# Patient Record
Sex: Female | Born: 1980 | Race: White | Hispanic: No | Marital: Single | Smoking: Never smoker
Health system: Southern US, Community
[De-identification: ages and names within clinical notes are randomized; demographics above are authoritative.]

## PROBLEM LIST (undated history)

## (undated) DIAGNOSIS — K259 Gastric ulcer, unspecified as acute or chronic, without hemorrhage or perforation: Secondary | ICD-10-CM

## (undated) DIAGNOSIS — F39 Unspecified mood [affective] disorder: Secondary | ICD-10-CM

## (undated) DIAGNOSIS — K219 Gastro-esophageal reflux disease without esophagitis: Secondary | ICD-10-CM

## (undated) DIAGNOSIS — N809 Endometriosis, unspecified: Secondary | ICD-10-CM

## (undated) HISTORY — PX: DILATION AND CURETTAGE OF UTERUS: SHX78

---

## 2004-02-28 ENCOUNTER — Observation Stay: Payer: Self-pay

## 2004-03-08 ENCOUNTER — Observation Stay: Payer: Self-pay | Admitting: Obstetrics and Gynecology

## 2004-03-17 ENCOUNTER — Observation Stay: Payer: Self-pay

## 2004-03-19 ENCOUNTER — Observation Stay: Payer: Self-pay

## 2004-03-22 ENCOUNTER — Inpatient Hospital Stay: Payer: Self-pay

## 2004-11-15 ENCOUNTER — Observation Stay: Payer: Self-pay | Admitting: Obstetrics and Gynecology

## 2005-01-25 ENCOUNTER — Observation Stay: Payer: Self-pay | Admitting: Obstetrics and Gynecology

## 2005-02-05 ENCOUNTER — Observation Stay: Payer: Self-pay

## 2005-02-22 ENCOUNTER — Inpatient Hospital Stay: Payer: Self-pay

## 2005-04-15 ENCOUNTER — Inpatient Hospital Stay: Payer: Self-pay | Admitting: Psychiatry

## 2005-04-15 ENCOUNTER — Other Ambulatory Visit: Payer: Self-pay

## 2005-06-06 ENCOUNTER — Emergency Department: Payer: Self-pay | Admitting: Emergency Medicine

## 2005-07-08 ENCOUNTER — Emergency Department: Payer: Self-pay | Admitting: Emergency Medicine

## 2005-12-18 ENCOUNTER — Ambulatory Visit: Payer: Self-pay

## 2006-05-06 ENCOUNTER — Emergency Department: Payer: Self-pay | Admitting: Emergency Medicine

## 2006-05-08 ENCOUNTER — Ambulatory Visit: Payer: Self-pay | Admitting: Emergency Medicine

## 2006-05-09 ENCOUNTER — Emergency Department: Payer: Self-pay | Admitting: Emergency Medicine

## 2006-09-15 ENCOUNTER — Emergency Department: Payer: Self-pay | Admitting: Emergency Medicine

## 2006-10-17 ENCOUNTER — Emergency Department: Payer: Self-pay | Admitting: Emergency Medicine

## 2006-10-26 ENCOUNTER — Emergency Department: Payer: Self-pay

## 2006-11-12 ENCOUNTER — Inpatient Hospital Stay: Payer: Self-pay

## 2006-12-11 ENCOUNTER — Observation Stay: Payer: Self-pay | Admitting: Unknown Physician Specialty

## 2006-12-11 ENCOUNTER — Emergency Department: Payer: Self-pay | Admitting: Emergency Medicine

## 2006-12-12 ENCOUNTER — Observation Stay: Payer: Self-pay

## 2006-12-31 ENCOUNTER — Observation Stay: Payer: Self-pay | Admitting: Obstetrics & Gynecology

## 2007-01-05 ENCOUNTER — Emergency Department: Payer: Self-pay | Admitting: Emergency Medicine

## 2007-01-12 ENCOUNTER — Observation Stay: Payer: Self-pay

## 2007-02-23 ENCOUNTER — Observation Stay: Payer: Self-pay | Admitting: Obstetrics and Gynecology

## 2007-03-07 ENCOUNTER — Observation Stay: Payer: Self-pay

## 2007-03-08 ENCOUNTER — Observation Stay: Payer: Self-pay | Admitting: Obstetrics & Gynecology

## 2007-03-19 ENCOUNTER — Observation Stay: Payer: Self-pay | Admitting: Obstetrics & Gynecology

## 2007-03-21 ENCOUNTER — Inpatient Hospital Stay: Payer: Self-pay | Admitting: Obstetrics and Gynecology

## 2007-03-28 ENCOUNTER — Observation Stay: Payer: Self-pay | Admitting: Obstetrics & Gynecology

## 2007-03-30 ENCOUNTER — Inpatient Hospital Stay: Payer: Self-pay | Admitting: Obstetrics & Gynecology

## 2007-03-30 ENCOUNTER — Ambulatory Visit: Payer: Self-pay | Admitting: Obstetrics & Gynecology

## 2007-05-10 ENCOUNTER — Emergency Department: Payer: Self-pay | Admitting: Emergency Medicine

## 2007-05-17 ENCOUNTER — Emergency Department: Payer: Self-pay | Admitting: Emergency Medicine

## 2007-05-21 ENCOUNTER — Ambulatory Visit: Payer: Self-pay | Admitting: Obstetrics & Gynecology

## 2007-05-22 ENCOUNTER — Ambulatory Visit: Payer: Self-pay | Admitting: Obstetrics & Gynecology

## 2007-07-28 ENCOUNTER — Emergency Department (HOSPITAL_COMMUNITY): Admission: EM | Admit: 2007-07-28 | Discharge: 2007-07-28 | Payer: Self-pay | Admitting: Emergency Medicine

## 2007-07-31 ENCOUNTER — Emergency Department (HOSPITAL_COMMUNITY): Admission: EM | Admit: 2007-07-31 | Discharge: 2007-07-31 | Payer: Self-pay | Admitting: Emergency Medicine

## 2007-08-08 ENCOUNTER — Emergency Department (HOSPITAL_COMMUNITY): Admission: EM | Admit: 2007-08-08 | Discharge: 2007-08-08 | Payer: Self-pay | Admitting: Emergency Medicine

## 2007-08-10 ENCOUNTER — Emergency Department (HOSPITAL_COMMUNITY): Admission: EM | Admit: 2007-08-10 | Discharge: 2007-08-10 | Payer: Self-pay | Admitting: Emergency Medicine

## 2007-08-12 ENCOUNTER — Ambulatory Visit: Payer: Self-pay | Admitting: Family Medicine

## 2007-08-29 ENCOUNTER — Emergency Department: Payer: Self-pay | Admitting: Emergency Medicine

## 2007-09-12 ENCOUNTER — Emergency Department: Payer: Self-pay | Admitting: Internal Medicine

## 2007-09-13 ENCOUNTER — Emergency Department: Payer: Self-pay | Admitting: Internal Medicine

## 2007-11-08 ENCOUNTER — Emergency Department: Payer: Self-pay | Admitting: Emergency Medicine

## 2007-11-22 ENCOUNTER — Inpatient Hospital Stay: Payer: Self-pay

## 2007-11-25 ENCOUNTER — Emergency Department: Payer: Self-pay | Admitting: Emergency Medicine

## 2007-11-26 ENCOUNTER — Emergency Department: Payer: Self-pay | Admitting: Emergency Medicine

## 2007-12-05 ENCOUNTER — Ambulatory Visit: Payer: Self-pay

## 2007-12-06 ENCOUNTER — Inpatient Hospital Stay: Payer: Self-pay | Admitting: Obstetrics & Gynecology

## 2007-12-06 ENCOUNTER — Ambulatory Visit: Payer: Self-pay | Admitting: Cardiology

## 2007-12-07 ENCOUNTER — Other Ambulatory Visit: Payer: Self-pay

## 2007-12-23 ENCOUNTER — Other Ambulatory Visit: Payer: Self-pay

## 2007-12-23 ENCOUNTER — Emergency Department: Payer: Self-pay | Admitting: Emergency Medicine

## 2008-01-03 ENCOUNTER — Emergency Department: Payer: Self-pay | Admitting: Emergency Medicine

## 2008-01-07 ENCOUNTER — Emergency Department: Payer: Self-pay | Admitting: Emergency Medicine

## 2008-01-21 ENCOUNTER — Emergency Department: Payer: Self-pay | Admitting: Internal Medicine

## 2008-01-21 ENCOUNTER — Other Ambulatory Visit: Payer: Self-pay

## 2008-01-25 ENCOUNTER — Emergency Department: Payer: Self-pay | Admitting: Emergency Medicine

## 2008-01-30 ENCOUNTER — Emergency Department: Payer: Self-pay

## 2008-02-03 ENCOUNTER — Emergency Department: Payer: Self-pay | Admitting: Unknown Physician Specialty

## 2008-03-04 ENCOUNTER — Emergency Department: Payer: Self-pay | Admitting: Internal Medicine

## 2008-05-11 ENCOUNTER — Emergency Department: Payer: Self-pay | Admitting: Emergency Medicine

## 2008-05-25 ENCOUNTER — Emergency Department: Payer: Self-pay | Admitting: Emergency Medicine

## 2008-06-06 ENCOUNTER — Emergency Department: Payer: Self-pay | Admitting: Emergency Medicine

## 2008-06-10 ENCOUNTER — Emergency Department: Payer: Self-pay | Admitting: Internal Medicine

## 2008-07-13 ENCOUNTER — Emergency Department: Payer: Self-pay | Admitting: Emergency Medicine

## 2008-08-11 ENCOUNTER — Ambulatory Visit: Payer: Self-pay | Admitting: Obstetrics & Gynecology

## 2008-09-27 ENCOUNTER — Emergency Department: Payer: Self-pay | Admitting: Emergency Medicine

## 2008-10-07 ENCOUNTER — Emergency Department: Payer: Self-pay | Admitting: Emergency Medicine

## 2008-10-09 ENCOUNTER — Ambulatory Visit: Payer: Self-pay | Admitting: Family Medicine

## 2008-10-30 ENCOUNTER — Emergency Department: Payer: Self-pay | Admitting: Emergency Medicine

## 2008-11-03 ENCOUNTER — Emergency Department: Payer: Self-pay | Admitting: Emergency Medicine

## 2008-12-03 ENCOUNTER — Emergency Department: Payer: Self-pay | Admitting: Emergency Medicine

## 2008-12-04 ENCOUNTER — Emergency Department: Payer: Self-pay

## 2008-12-31 ENCOUNTER — Emergency Department: Payer: Self-pay | Admitting: Emergency Medicine

## 2009-01-28 ENCOUNTER — Emergency Department: Payer: Self-pay | Admitting: Emergency Medicine

## 2009-02-15 ENCOUNTER — Emergency Department: Payer: Self-pay | Admitting: Internal Medicine

## 2009-03-22 ENCOUNTER — Emergency Department: Payer: Self-pay | Admitting: Unknown Physician Specialty

## 2009-04-07 ENCOUNTER — Emergency Department: Payer: Self-pay | Admitting: Emergency Medicine

## 2009-04-11 ENCOUNTER — Emergency Department: Payer: Self-pay | Admitting: Unknown Physician Specialty

## 2009-05-25 ENCOUNTER — Emergency Department: Payer: Self-pay | Admitting: Emergency Medicine

## 2009-06-18 ENCOUNTER — Emergency Department: Payer: Self-pay | Admitting: Emergency Medicine

## 2009-06-23 ENCOUNTER — Emergency Department: Payer: Self-pay | Admitting: Emergency Medicine

## 2009-07-10 ENCOUNTER — Emergency Department: Payer: Self-pay | Admitting: Internal Medicine

## 2009-08-13 ENCOUNTER — Emergency Department: Payer: Self-pay | Admitting: Emergency Medicine

## 2009-08-16 ENCOUNTER — Emergency Department: Payer: Self-pay | Admitting: Emergency Medicine

## 2009-09-11 ENCOUNTER — Emergency Department: Payer: Self-pay | Admitting: Emergency Medicine

## 2009-12-02 ENCOUNTER — Emergency Department: Payer: Self-pay | Admitting: Emergency Medicine

## 2009-12-04 ENCOUNTER — Inpatient Hospital Stay: Payer: Self-pay | Admitting: Surgery

## 2009-12-30 ENCOUNTER — Inpatient Hospital Stay: Payer: Self-pay | Admitting: Internal Medicine

## 2010-01-04 ENCOUNTER — Emergency Department: Payer: Self-pay | Admitting: Emergency Medicine

## 2010-01-14 ENCOUNTER — Emergency Department: Payer: Self-pay | Admitting: Internal Medicine

## 2010-02-02 ENCOUNTER — Emergency Department: Payer: Self-pay | Admitting: Emergency Medicine

## 2010-06-03 ENCOUNTER — Emergency Department: Payer: Self-pay | Admitting: Emergency Medicine

## 2010-06-04 ENCOUNTER — Emergency Department: Payer: Self-pay | Admitting: Emergency Medicine

## 2010-06-25 ENCOUNTER — Emergency Department: Payer: Self-pay | Admitting: Emergency Medicine

## 2010-10-19 ENCOUNTER — Emergency Department: Payer: Self-pay | Admitting: Internal Medicine

## 2010-12-09 ENCOUNTER — Emergency Department: Payer: Self-pay | Admitting: Emergency Medicine

## 2010-12-15 ENCOUNTER — Emergency Department: Payer: Self-pay | Admitting: Emergency Medicine

## 2010-12-21 ENCOUNTER — Emergency Department: Payer: Self-pay | Admitting: Emergency Medicine

## 2011-01-16 ENCOUNTER — Emergency Department: Payer: Self-pay | Admitting: *Deleted

## 2011-01-17 ENCOUNTER — Emergency Department: Payer: Self-pay | Admitting: Emergency Medicine

## 2011-02-07 LAB — COMPREHENSIVE METABOLIC PANEL
BUN: 6
Calcium: 8.6
Creatinine, Ser: 0.73
Glucose, Bld: 109 — ABNORMAL HIGH
Total Protein: 7.3

## 2011-02-07 LAB — DIFFERENTIAL
Basophils Relative: 0
Lymphocytes Relative: 8 — ABNORMAL LOW
Lymphs Abs: 1.2
Monocytes Relative: 8
Neutro Abs: 12 — ABNORMAL HIGH
Neutrophils Relative %: 83 — ABNORMAL HIGH

## 2011-02-07 LAB — URINALYSIS, ROUTINE W REFLEX MICROSCOPIC
Glucose, UA: NEGATIVE
Protein, ur: NEGATIVE
Specific Gravity, Urine: 1.025
pH: 5.5

## 2011-02-07 LAB — CBC
HCT: 36.9
Hemoglobin: 12.6
MCHC: 34
Platelets: 242
RDW: 15.6 — ABNORMAL HIGH

## 2011-03-12 ENCOUNTER — Emergency Department: Payer: Self-pay | Admitting: Emergency Medicine

## 2011-04-28 ENCOUNTER — Emergency Department: Payer: Self-pay | Admitting: Emergency Medicine

## 2011-06-17 ENCOUNTER — Emergency Department: Payer: Self-pay | Admitting: Emergency Medicine

## 2011-06-29 ENCOUNTER — Emergency Department: Payer: Self-pay | Admitting: Emergency Medicine

## 2011-06-29 LAB — CBC
HCT: 39.5 % (ref 35.0–47.0)
HGB: 13.3 g/dL (ref 12.0–16.0)
Platelet: 202 10*3/uL (ref 150–440)
WBC: 13.8 10*3/uL — ABNORMAL HIGH (ref 3.6–11.0)

## 2011-06-29 LAB — BASIC METABOLIC PANEL
BUN: 7 mg/dL (ref 7–18)
Calcium, Total: 8.6 mg/dL (ref 8.5–10.1)
EGFR (Non-African Amer.): >60
Glucose: 94 mg/dL (ref 65–99)
Potassium: 3.8 mmol/L (ref 3.5–5.1)
Sodium: 137 mmol/L (ref 136–145)

## 2011-06-29 LAB — DIFFERENTIAL
Basophil %: 0.2 %
Eosinophil %: 0.2 %
Lymphocyte #: 1.8 10*3/uL (ref 1.0–3.6)
Monocyte #: 0.7 10*3/uL (ref 0.0–0.7)
Monocyte %: 5.2 %
Neutrophil #: 11.3 10*3/uL — ABNORMAL HIGH (ref 1.4–6.5)

## 2011-08-03 ENCOUNTER — Emergency Department: Payer: Self-pay | Admitting: Unknown Physician Specialty

## 2011-08-03 LAB — HCG, QUANTITATIVE, PREGNANCY: Beta Hcg, Quant.: <1 m[IU]/mL — ABNORMAL LOW

## 2011-08-03 LAB — COMPREHENSIVE METABOLIC PANEL
Albumin: 4.7 g/dL (ref 3.4–5.0)
Alkaline Phosphatase: 62 U/L (ref 50–136)
BUN: 10 mg/dL (ref 7–18)
Bilirubin,Total: 0.3 mg/dL (ref 0.2–1.0)
Co2: 24 mmol/L (ref 21–32)
Creatinine: 0.74 mg/dL (ref 0.60–1.30)
Glucose: 100 mg/dL — ABNORMAL HIGH (ref 65–99)
Osmolality: 275 (ref 275–301)
SGPT (ALT): 32 U/L
Total Protein: 8.7 g/dL — ABNORMAL HIGH (ref 6.4–8.2)

## 2011-08-03 LAB — CBC
HCT: 42.9 % (ref 35.0–47.0)
HGB: 14.4 g/dL (ref 12.0–16.0)
MCH: 28.7 pg (ref 26.0–34.0)
MCHC: 33.5 g/dL (ref 32.0–36.0)
Platelet: 248 10*3/uL (ref 150–440)
RBC: 5 10*6/uL (ref 3.80–5.20)
WBC: 11.4 10*3/uL — ABNORMAL HIGH (ref 3.6–11.0)

## 2011-08-19 ENCOUNTER — Emergency Department: Payer: Self-pay | Admitting: Emergency Medicine

## 2011-09-01 ENCOUNTER — Emergency Department: Payer: Self-pay | Admitting: Emergency Medicine

## 2011-09-01 LAB — COMPREHENSIVE METABOLIC PANEL
BUN: 9 mg/dL (ref 7–18)
Bilirubin,Total: 0.4 mg/dL (ref 0.2–1.0)
Calcium, Total: 9.5 mg/dL (ref 8.5–10.1)
Chloride: 103 mmol/L (ref 98–107)
Creatinine: 0.83 mg/dL (ref 0.60–1.30)
Glucose: 93 mg/dL (ref 65–99)
SGPT (ALT): 29 U/L
Sodium: 137 mmol/L (ref 136–145)

## 2011-09-01 LAB — CBC
MCHC: 33.9 g/dL (ref 32.0–36.0)
MCV: 85 fL (ref 80–100)
Platelet: 244 10*3/uL (ref 150–440)
WBC: 9.9 10*3/uL (ref 3.6–11.0)

## 2011-09-01 LAB — PREGNANCY, URINE: Pregnancy Test, Urine: NEGATIVE m[IU]/mL

## 2011-09-01 LAB — URINALYSIS, COMPLETE
Bacteria: NONE SEEN
Bilirubin,UR: NEGATIVE
Glucose,UR: NEGATIVE mg/dL (ref 0–75)
Leukocyte Esterase: NEGATIVE
Ph: 5 (ref 4.5–8.0)
Specific Gravity: 1.014 (ref 1.003–1.030)
Squamous Epithelial: 2
WBC UR: 1 /HPF (ref 0–5)

## 2011-09-01 LAB — HCG, QUANTITATIVE, PREGNANCY: Beta Hcg, Quant.: <1 m[IU]/mL — ABNORMAL LOW

## 2011-09-05 ENCOUNTER — Emergency Department: Payer: Self-pay | Admitting: Emergency Medicine

## 2011-09-07 ENCOUNTER — Emergency Department: Payer: Self-pay

## 2011-09-07 LAB — URINALYSIS, COMPLETE
Bacteria: NONE SEEN
Bilirubin,UR: NEGATIVE
Glucose,UR: NEGATIVE mg/dL (ref 0–75)
Nitrite: NEGATIVE
Specific Gravity: 1.015 (ref 1.003–1.030)
WBC UR: 5 /HPF (ref 0–5)

## 2011-09-07 LAB — CBC
MCHC: 33 g/dL (ref 32.0–36.0)
MCV: 86 fL (ref 80–100)
Platelet: 222 10*3/uL (ref 150–440)
RBC: 4.66 10*6/uL (ref 3.80–5.20)
RDW: 13 % (ref 11.5–14.5)
WBC: 7.4 10*3/uL (ref 3.6–11.0)

## 2011-09-07 LAB — DRUG SCREEN, URINE

## 2011-09-09 ENCOUNTER — Emergency Department: Payer: Self-pay | Admitting: Emergency Medicine

## 2011-12-14 ENCOUNTER — Emergency Department: Payer: Self-pay | Admitting: Emergency Medicine

## 2011-12-14 LAB — CBC
HCT: 39.8 % (ref 35.0–47.0)
RBC: 4.67 10*6/uL (ref 3.80–5.20)
RDW: 13 % (ref 11.5–14.5)
WBC: 7.9 10*3/uL (ref 3.6–11.0)

## 2011-12-14 LAB — COMPREHENSIVE METABOLIC PANEL
Alkaline Phosphatase: 77 U/L (ref 50–136)
Anion Gap: 10 (ref 7–16)
Calcium, Total: 8.8 mg/dL (ref 8.5–10.1)
Co2: 26 mmol/L (ref 21–32)
EGFR (African American): >60
EGFR (Non-African Amer.): >60
Osmolality: 280 (ref 275–301)
Sodium: 140 mmol/L (ref 136–145)

## 2011-12-14 LAB — URINALYSIS, COMPLETE
Bilirubin,UR: NEGATIVE
Ketone: NEGATIVE
Leukocyte Esterase: NEGATIVE
Ph: 5 (ref 4.5–8.0)
Protein: NEGATIVE
Specific Gravity: 1.01 (ref 1.003–1.030)
Squamous Epithelial: <1

## 2011-12-14 LAB — HCG, QUANTITATIVE, PREGNANCY: Beta Hcg, Quant.: <1 m[IU]/mL — ABNORMAL LOW

## 2011-12-31 ENCOUNTER — Emergency Department: Payer: Self-pay | Admitting: Emergency Medicine

## 2011-12-31 LAB — URINALYSIS, COMPLETE
Bacteria: NONE SEEN
Bilirubin,UR: NEGATIVE
Glucose,UR: NEGATIVE mg/dL (ref 0–75)
Leukocyte Esterase: NEGATIVE
Nitrite: NEGATIVE
Specific Gravity: 1.018 (ref 1.003–1.030)
WBC UR: 2 /HPF (ref 0–5)

## 2011-12-31 LAB — COMPREHENSIVE METABOLIC PANEL
Albumin: 4.5 g/dL (ref 3.4–5.0)
Bilirubin,Total: 0.5 mg/dL (ref 0.2–1.0)
Chloride: 105 mmol/L (ref 98–107)
Creatinine: 0.78 mg/dL (ref 0.60–1.30)
EGFR (African American): >60
Potassium: 3.7 mmol/L (ref 3.5–5.1)
SGOT(AST): 36 U/L (ref 15–37)
SGPT (ALT): 52 U/L (ref 12–78)
Total Protein: 8.3 g/dL — ABNORMAL HIGH (ref 6.4–8.2)

## 2011-12-31 LAB — CBC WITH DIFFERENTIAL/PLATELET
Basophil #: 0.1 10*3/uL (ref 0.0–0.1)
Eosinophil #: 0.1 10*3/uL (ref 0.0–0.7)
HCT: 42 % (ref 35.0–47.0)
Lymphocyte %: 42.2 %
MCHC: 33.1 g/dL (ref 32.0–36.0)
Monocyte %: 10 %
Neutrophil #: 4.4 10*3/uL (ref 1.4–6.5)
Neutrophil %: 45.8 %
RDW: 12.9 % (ref 11.5–14.5)

## 2011-12-31 LAB — WET PREP, GENITAL

## 2011-12-31 LAB — HCG, QUANTITATIVE, PREGNANCY: Beta Hcg, Quant.: <1 m[IU]/mL — ABNORMAL LOW

## 2012-01-25 ENCOUNTER — Emergency Department: Payer: Self-pay | Admitting: Emergency Medicine

## 2012-01-25 LAB — URINALYSIS, COMPLETE
Bacteria: NONE SEEN
Blood: NEGATIVE
Ketone: NEGATIVE
Nitrite: NEGATIVE
Ph: 5 (ref 4.5–8.0)
Protein: NEGATIVE
RBC,UR: 2 /HPF (ref 0–5)
Specific Gravity: 1.02 (ref 1.003–1.030)
Squamous Epithelial: 12
WBC UR: 1 /HPF (ref 0–5)

## 2012-01-25 LAB — CBC
HCT: 40.9 % (ref 35.0–47.0)
HGB: 13.8 g/dL (ref 12.0–16.0)
MCV: 85 fL (ref 80–100)
Platelet: 231 10*3/uL (ref 150–440)
RBC: 4.8 10*6/uL (ref 3.80–5.20)
WBC: 6.3 10*3/uL (ref 3.6–11.0)

## 2012-01-25 LAB — COMPREHENSIVE METABOLIC PANEL
BUN: 11 mg/dL (ref 7–18)
Chloride: 104 mmol/L (ref 98–107)
Co2: 26 mmol/L (ref 21–32)
EGFR (African American): >60
EGFR (Non-African Amer.): >60
Osmolality: 274 (ref 275–301)
SGOT(AST): 26 U/L (ref 15–37)
SGPT (ALT): 29 U/L (ref 12–78)
Total Protein: 8 g/dL (ref 6.4–8.2)

## 2012-01-25 LAB — PREGNANCY, URINE: Pregnancy Test, Urine: NEGATIVE m[IU]/mL

## 2012-01-27 ENCOUNTER — Emergency Department: Payer: Self-pay | Admitting: Emergency Medicine

## 2012-01-27 LAB — URINALYSIS, COMPLETE
Bilirubin,UR: NEGATIVE
Blood: NEGATIVE
Glucose,UR: NEGATIVE mg/dL (ref 0–75)
Ketone: NEGATIVE
Leukocyte Esterase: NEGATIVE
Ph: 5 (ref 4.5–8.0)
Specific Gravity: 1.017 (ref 1.003–1.030)
Squamous Epithelial: 2

## 2012-01-27 LAB — COMPREHENSIVE METABOLIC PANEL
Alkaline Phosphatase: 62 U/L (ref 50–136)
Anion Gap: 12 (ref 7–16)
Calcium, Total: 8.4 mg/dL — ABNORMAL LOW (ref 8.5–10.1)
Co2: 24 mmol/L (ref 21–32)
EGFR (African American): >60
Glucose: 118 mg/dL — ABNORMAL HIGH (ref 65–99)
SGOT(AST): 24 U/L (ref 15–37)

## 2012-01-27 LAB — CBC
HCT: 38 % (ref 35.0–47.0)
HGB: 13.3 g/dL (ref 12.0–16.0)
MCHC: 35 g/dL (ref 32.0–36.0)

## 2012-01-27 LAB — LIPASE, BLOOD: Lipase: 48 U/L — ABNORMAL LOW (ref 73–393)

## 2012-01-27 LAB — HCG, QUANTITATIVE, PREGNANCY: Beta Hcg, Quant.: <1 m[IU]/mL — ABNORMAL LOW

## 2012-02-12 ENCOUNTER — Emergency Department: Payer: Self-pay | Admitting: Emergency Medicine

## 2012-02-12 LAB — LIPASE, BLOOD: Lipase: 115 U/L (ref 73–393)

## 2012-02-12 LAB — COMPREHENSIVE METABOLIC PANEL
Anion Gap: 9 (ref 7–16)
Bilirubin,Total: 0.3 mg/dL (ref 0.2–1.0)
Chloride: 105 mmol/L (ref 98–107)
Co2: 29 mmol/L (ref 21–32)
Creatinine: 1.03 mg/dL (ref 0.60–1.30)
EGFR (African American): >60
EGFR (Non-African Amer.): >60
SGOT(AST): 19 U/L (ref 15–37)
SGPT (ALT): 24 U/L (ref 12–78)
Total Protein: 7.5 g/dL (ref 6.4–8.2)

## 2012-02-12 LAB — CBC
HCT: 38.9 % (ref 35.0–47.0)
MCV: 86 fL (ref 80–100)
Platelet: 239 10*3/uL (ref 150–440)
RBC: 4.52 10*6/uL (ref 3.80–5.20)
WBC: 5.6 10*3/uL (ref 3.6–11.0)

## 2012-03-11 ENCOUNTER — Emergency Department: Payer: Self-pay | Admitting: Unknown Physician Specialty

## 2012-03-11 LAB — URINALYSIS, COMPLETE
Bacteria: NONE SEEN
Glucose,UR: NEGATIVE mg/dL (ref 0–75)
Leukocyte Esterase: NEGATIVE
Nitrite: NEGATIVE
WBC UR: <1 /HPF (ref 0–5)

## 2012-03-11 LAB — DRUG SCREEN, URINE
Amphetamines, Ur Screen: NEGATIVE (ref ?–1000)
Barbiturates, Ur Screen: NEGATIVE (ref ?–200)
Benzodiazepine, Ur Scrn: POSITIVE (ref ?–200)
Methadone, Ur Screen: NEGATIVE (ref ?–300)
Opiate, Ur Screen: POSITIVE (ref ?–300)
Tricyclic, Ur Screen: NEGATIVE (ref ?–1000)

## 2012-03-11 LAB — COMPREHENSIVE METABOLIC PANEL
Bilirubin,Total: 0.4 mg/dL (ref 0.2–1.0)
Chloride: 104 mmol/L (ref 98–107)
Co2: 27 mmol/L (ref 21–32)
Creatinine: 0.73 mg/dL (ref 0.60–1.30)
EGFR (Non-African Amer.): >60
SGPT (ALT): 25 U/L (ref 12–78)

## 2012-03-11 LAB — ETHANOL
Ethanol %: <0.003 % (ref 0.000–0.080)
Ethanol: <3 mg/dL

## 2012-03-11 LAB — MAGNESIUM: Magnesium: 2.2 mg/dL

## 2012-03-11 LAB — CBC
HCT: 40.7 % (ref 35.0–47.0)
MCHC: 33.3 g/dL (ref 32.0–36.0)
MCV: 86 fL (ref 80–100)
Platelet: 220 10*3/uL (ref 150–440)
RDW: 13.1 % (ref 11.5–14.5)

## 2012-03-11 LAB — WET PREP, GENITAL

## 2012-04-08 ENCOUNTER — Emergency Department: Payer: Self-pay | Admitting: Emergency Medicine

## 2012-04-08 LAB — COMPREHENSIVE METABOLIC PANEL
Albumin: 4.7 g/dL (ref 3.4–5.0)
Anion Gap: 9 (ref 7–16)
Bilirubin,Total: 0.6 mg/dL (ref 0.2–1.0)
Calcium, Total: 9.1 mg/dL (ref 8.5–10.1)
Co2: 24 mmol/L (ref 21–32)
EGFR (African American): >60
EGFR (Non-African Amer.): >60
Glucose: 96 mg/dL (ref 65–99)
Osmolality: 270 (ref 275–301)
Potassium: 3.5 mmol/L (ref 3.5–5.1)
SGOT(AST): 23 U/L (ref 15–37)
Sodium: 136 mmol/L (ref 136–145)

## 2012-04-08 LAB — CBC
MCH: 29 pg (ref 26.0–34.0)
MCV: 85 fL (ref 80–100)
Platelet: 244 10*3/uL (ref 150–440)
RDW: 12.8 % (ref 11.5–14.5)
WBC: 14.3 10*3/uL — ABNORMAL HIGH (ref 3.6–11.0)

## 2012-04-08 LAB — WET PREP, GENITAL

## 2012-04-08 LAB — URINALYSIS, COMPLETE
Bacteria: NONE SEEN
Bilirubin,UR: NEGATIVE
Glucose,UR: NEGATIVE mg/dL (ref 0–75)
Ph: 6 (ref 4.5–8.0)
RBC,UR: 3 /HPF (ref 0–5)
Specific Gravity: 1.008 (ref 1.003–1.030)
WBC UR: 15 /HPF (ref 0–5)

## 2012-04-08 LAB — PREGNANCY, URINE: Pregnancy Test, Urine: NEGATIVE m[IU]/mL

## 2012-04-09 ENCOUNTER — Emergency Department: Payer: Self-pay | Admitting: Emergency Medicine

## 2012-04-09 LAB — CBC
HCT: 38.6 % (ref 35.0–47.0)
HGB: 13.4 g/dL (ref 12.0–16.0)
MCH: 29.5 pg (ref 26.0–34.0)
MCV: 85 fL (ref 80–100)
Platelet: 207 10*3/uL (ref 150–440)
RBC: 4.54 10*6/uL (ref 3.80–5.20)
WBC: 10.1 10*3/uL (ref 3.6–11.0)

## 2012-04-10 ENCOUNTER — Emergency Department: Payer: Self-pay | Admitting: Emergency Medicine

## 2012-04-10 LAB — URINALYSIS, COMPLETE
Bilirubin,UR: NEGATIVE
Blood: NEGATIVE
Glucose,UR: NEGATIVE mg/dL (ref 0–75)
Nitrite: NEGATIVE
Ph: 6 (ref 4.5–8.0)
Specific Gravity: 1.024 (ref 1.003–1.030)
WBC UR: 70 /HPF (ref 0–5)

## 2012-04-10 LAB — CBC WITH DIFFERENTIAL/PLATELET
Eosinophil #: 0.1 10*3/uL (ref 0.0–0.7)
Eosinophil %: 2.1 %
Lymphocyte #: 2.6 10*3/uL (ref 1.0–3.6)
Lymphocyte %: 37.1 %
MCHC: 34.2 g/dL (ref 32.0–36.0)
Monocyte %: 10.3 %
Platelet: 185 10*3/uL (ref 150–440)
RBC: 4.61 10*6/uL (ref 3.80–5.20)
RDW: 12.9 % (ref 11.5–14.5)
WBC: 6.9 10*3/uL (ref 3.6–11.0)

## 2012-04-10 LAB — COMPREHENSIVE METABOLIC PANEL
Albumin: 4.5 g/dL (ref 3.4–5.0)
Albumin: 4.5 g/dL (ref 3.4–5.0)
Alkaline Phosphatase: 78 U/L (ref 50–136)
Anion Gap: 3 — ABNORMAL LOW (ref 7–16)
BUN: 8 mg/dL (ref 7–18)
Bilirubin,Total: 0.4 mg/dL (ref 0.2–1.0)
Bilirubin,Total: 0.6 mg/dL (ref 0.2–1.0)
Chloride: 109 mmol/L — ABNORMAL HIGH (ref 98–107)
Co2: 27 mmol/L (ref 21–32)
Creatinine: 0.71 mg/dL (ref 0.60–1.30)
Creatinine: 0.77 mg/dL (ref 0.60–1.30)
EGFR (African American): >60
EGFR (Non-African Amer.): >60
Glucose: 105 mg/dL — ABNORMAL HIGH (ref 65–99)
Glucose: 91 mg/dL (ref 65–99)
Osmolality: 277 (ref 275–301)
Potassium: 3.6 mmol/L (ref 3.5–5.1)
SGPT (ALT): 21 U/L (ref 12–78)
Sodium: 139 mmol/L (ref 136–145)
Total Protein: 7.2 g/dL (ref 6.4–8.2)
Total Protein: 7.2 g/dL (ref 6.4–8.2)

## 2012-04-10 LAB — WET PREP, GENITAL

## 2012-04-10 LAB — LIPASE, BLOOD: Lipase: 110 U/L (ref 73–393)

## 2012-04-11 ENCOUNTER — Inpatient Hospital Stay: Payer: Self-pay | Admitting: Internal Medicine

## 2012-04-11 LAB — CBC
HCT: 27.1 % — ABNORMAL LOW (ref 35.0–47.0)
HGB: 9 g/dL — ABNORMAL LOW (ref 12.0–16.0)
MCHC: 33.2 g/dL (ref 32.0–36.0)
MCHC: 33.5 g/dL (ref 32.0–36.0)
Platelet: 174 10*3/uL (ref 150–440)
RBC: 3.18 10*6/uL — ABNORMAL LOW (ref 3.80–5.20)
RBC: 2.86 10*6/uL — ABNORMAL LOW (ref 3.80–5.20)
RDW: 12.6 % (ref 11.5–14.5)
WBC: 12.9 10*3/uL — ABNORMAL HIGH (ref 3.6–11.0)

## 2012-04-11 LAB — URINALYSIS, COMPLETE
Bilirubin,UR: NEGATIVE
Glucose,UR: NEGATIVE mg/dL (ref 0–75)
Hyaline Cast: 2
Ketone: NEGATIVE
Nitrite: NEGATIVE
Ph: 5 (ref 4.5–8.0)
Protein: NEGATIVE
RBC,UR: 2 /HPF (ref 0–5)
Squamous Epithelial: 11
WBC UR: 4 /HPF (ref 0–5)

## 2012-04-11 LAB — ETHANOL: Ethanol: <3 mg/dL

## 2012-04-11 LAB — COMPREHENSIVE METABOLIC PANEL
Albumin: 3.6 g/dL (ref 3.4–5.0)
Anion Gap: 7 (ref 7–16)
Bilirubin,Total: 0.2 mg/dL (ref 0.2–1.0)
Calcium, Total: 7.9 mg/dL — ABNORMAL LOW (ref 8.5–10.1)
Chloride: 109 mmol/L — ABNORMAL HIGH (ref 98–107)
EGFR (Non-African Amer.): >60
Glucose: 130 mg/dL — ABNORMAL HIGH (ref 65–99)
Osmolality: 287 (ref 275–301)
SGOT(AST): 18 U/L (ref 15–37)
SGPT (ALT): 19 U/L (ref 12–78)
Sodium: 139 mmol/L (ref 136–145)

## 2012-04-11 LAB — DRUG SCREEN, URINE
Amphetamines, Ur Screen: NEGATIVE (ref ?–1000)
Barbiturates, Ur Screen: NEGATIVE (ref ?–200)
Cannabinoid 50 Ng, Ur ~~LOC~~: NEGATIVE (ref ?–50)
Cocaine Metabolite,Ur ~~LOC~~: NEGATIVE (ref ?–300)
Opiate, Ur Screen: NEGATIVE (ref ?–300)
Tricyclic, Ur Screen: NEGATIVE (ref ?–1000)

## 2012-04-11 LAB — HCG, QUANTITATIVE, PREGNANCY: Beta Hcg, Quant.: <1 m[IU]/mL — ABNORMAL LOW

## 2012-04-12 LAB — CBC WITH DIFFERENTIAL/PLATELET
Basophil #: 0 10*3/uL (ref 0.0–0.1)
Basophil %: 0.3 %
Basophil %: 0.3 %
Eosinophil #: 0 10*3/uL (ref 0.0–0.7)
Eosinophil #: 0.1 10*3/uL (ref 0.0–0.7)
HCT: 19.4 % — ABNORMAL LOW (ref 35.0–47.0)
HCT: 26.2 % — ABNORMAL LOW (ref 35.0–47.0)
HGB: 6.7 g/dL — ABNORMAL LOW (ref 12.0–16.0)
HGB: 9.2 g/dL — ABNORMAL LOW (ref 12.0–16.0)
Lymphocyte #: 3 10*3/uL (ref 1.0–3.6)
Lymphocyte #: 2.8 10*3/uL (ref 1.0–3.6)
Lymphocyte %: 46.6 %
MCH: 29.4 pg (ref 26.0–34.0)
MCH: 30.6 pg (ref 26.0–34.0)
MCHC: 34.6 g/dL (ref 32.0–36.0)
MCHC: 35 g/dL (ref 32.0–36.0)
MCV: 85 fL (ref 80–100)
MCV: 87 fL (ref 80–100)
Monocyte #: 0.6 x10 3/mm (ref 0.2–0.9)
Monocyte %: 8.6 %
Neutrophil #: 4.3 10*3/uL (ref 1.4–6.5)
Neutrophil #: 2.6 10*3/uL (ref 1.4–6.5)
Platelet: 139 10*3/uL — ABNORMAL LOW (ref 150–440)
RBC: 2.29 10*6/uL — ABNORMAL LOW (ref 3.80–5.20)
RDW: 12.6 % (ref 11.5–14.5)
RDW: 13.8 % (ref 11.5–14.5)
WBC: 7.8 10*3/uL (ref 3.6–11.0)

## 2012-04-14 LAB — URINE CULTURE

## 2012-04-16 LAB — PATHOLOGY REPORT

## 2012-04-20 ENCOUNTER — Emergency Department: Payer: Self-pay | Admitting: Emergency Medicine

## 2012-04-20 LAB — CBC
HGB: 11 g/dL — ABNORMAL LOW (ref 12.0–16.0)
MCH: 30.5 pg (ref 26.0–34.0)
Platelet: 264 10*3/uL (ref 150–440)
RBC: 3.61 10*6/uL — ABNORMAL LOW (ref 3.80–5.20)
RDW: 14.5 % (ref 11.5–14.5)

## 2012-04-20 LAB — URINALYSIS, COMPLETE
Bacteria: NONE SEEN
Bilirubin,UR: NEGATIVE
Blood: NEGATIVE
Glucose,UR: NEGATIVE mg/dL (ref 0–75)
Ph: 7 (ref 4.5–8.0)
Specific Gravity: 1.005 (ref 1.003–1.030)
Squamous Epithelial: 1
Transitional Epi: <1

## 2012-04-20 LAB — COMPREHENSIVE METABOLIC PANEL
Albumin: 4 g/dL (ref 3.4–5.0)
Alkaline Phosphatase: 49 U/L — ABNORMAL LOW (ref 50–136)
Anion Gap: 6 — ABNORMAL LOW (ref 7–16)
BUN: 8 mg/dL (ref 7–18)
Calcium, Total: 8.7 mg/dL (ref 8.5–10.1)
EGFR (African American): >60
EGFR (Non-African Amer.): >60
Glucose: 92 mg/dL (ref 65–99)
Osmolality: 276 (ref 275–301)
Potassium: 3.8 mmol/L (ref 3.5–5.1)
SGOT(AST): 35 U/L (ref 15–37)
SGPT (ALT): 53 U/L (ref 12–78)
Sodium: 139 mmol/L (ref 136–145)

## 2012-04-20 LAB — LIPASE, BLOOD: Lipase: 129 U/L (ref 73–393)

## 2012-07-03 ENCOUNTER — Emergency Department: Payer: Self-pay | Admitting: Emergency Medicine

## 2012-08-06 ENCOUNTER — Emergency Department: Payer: Self-pay | Admitting: Emergency Medicine

## 2012-08-06 LAB — COMPREHENSIVE METABOLIC PANEL
Alkaline Phosphatase: 71 U/L (ref 50–136)
Anion Gap: 6 — ABNORMAL LOW (ref 7–16)
BUN: 12 mg/dL (ref 7–18)
Bilirubin,Total: 0.2 mg/dL (ref 0.2–1.0)
Chloride: 107 mmol/L (ref 98–107)
EGFR (Non-African Amer.): >60
Glucose: 110 mg/dL — ABNORMAL HIGH (ref 65–99)
Osmolality: 274 (ref 275–301)
Total Protein: 7.6 g/dL (ref 6.4–8.2)

## 2012-08-06 LAB — CBC WITH DIFFERENTIAL/PLATELET
Basophil #: 0 10*3/uL (ref 0.0–0.1)
Eosinophil #: 0.5 10*3/uL (ref 0.0–0.7)
Eosinophil %: 5.1 %
HGB: 13.9 g/dL (ref 12.0–16.0)
MCV: 82 fL (ref 80–100)
Monocyte #: 0.9 x10 3/mm (ref 0.2–0.9)
Neutrophil #: 6 10*3/uL (ref 1.4–6.5)
Neutrophil %: 66.1 %
RBC: 4.99 10*6/uL (ref 3.80–5.20)
RDW: 13.1 % (ref 11.5–14.5)
WBC: 9.1 10*3/uL (ref 3.6–11.0)

## 2012-08-06 LAB — URINALYSIS, COMPLETE
Bilirubin,UR: NEGATIVE
Ketone: NEGATIVE
Nitrite: NEGATIVE
Ph: 5 (ref 4.5–8.0)
Protein: NEGATIVE
Specific Gravity: 1.026 (ref 1.003–1.030)
Squamous Epithelial: 12
WBC UR: 3 /HPF (ref 0–5)

## 2012-08-06 LAB — LIPASE, BLOOD: Lipase: 85 U/L (ref 73–393)

## 2012-08-06 LAB — PREGNANCY, URINE: Pregnancy Test, Urine: NEGATIVE m[IU]/mL

## 2012-09-01 LAB — COMPREHENSIVE METABOLIC PANEL
Albumin: 4.2 g/dL (ref 3.4–5.0)
Anion Gap: 8 (ref 7–16)
BUN: 14 mg/dL (ref 7–18)
Bilirubin,Total: 0.5 mg/dL (ref 0.2–1.0)
Calcium, Total: 8.4 mg/dL — ABNORMAL LOW (ref 8.5–10.1)
Co2: 25 mmol/L (ref 21–32)
Creatinine: 0.99 mg/dL (ref 0.60–1.30)
EGFR (African American): >60
EGFR (Non-African Amer.): >60
Potassium: 3.2 mmol/L — ABNORMAL LOW (ref 3.5–5.1)
SGOT(AST): 23 U/L (ref 15–37)
SGPT (ALT): 22 U/L (ref 12–78)
Total Protein: 7.7 g/dL (ref 6.4–8.2)

## 2012-09-01 LAB — DRUG SCREEN, URINE
Barbiturates, Ur Screen: NEGATIVE (ref ?–200)
Benzodiazepine, Ur Scrn: POSITIVE (ref ?–200)
Cannabinoid 50 Ng, Ur ~~LOC~~: NEGATIVE (ref ?–50)
Cocaine Metabolite,Ur ~~LOC~~: POSITIVE (ref ?–300)
Methadone, Ur Screen: NEGATIVE (ref ?–300)
Opiate, Ur Screen: NEGATIVE (ref ?–300)

## 2012-09-01 LAB — URINALYSIS, COMPLETE
Blood: NEGATIVE
Glucose,UR: NEGATIVE mg/dL (ref 0–75)
Nitrite: NEGATIVE
Ph: 7 (ref 4.5–8.0)
RBC,UR: 1 /HPF (ref 0–5)
Squamous Epithelial: 15

## 2012-09-01 LAB — ETHANOL: Ethanol %: <0.003 % (ref 0.000–0.080)

## 2012-09-01 LAB — CBC
HGB: 13.6 g/dL (ref 12.0–16.0)
MCHC: 33.9 g/dL (ref 32.0–36.0)
Platelet: 218 10*3/uL (ref 150–440)
RBC: 4.84 10*6/uL (ref 3.80–5.20)

## 2012-09-01 LAB — TSH: Thyroid Stimulating Horm: 1.1 u[IU]/mL

## 2012-09-02 ENCOUNTER — Inpatient Hospital Stay: Payer: Self-pay | Admitting: Psychiatry

## 2012-09-07 LAB — BEHAVIORAL MEDICINE 1 PANEL
Albumin: 3.7 g/dL (ref 3.4–5.0)
Basophil #: 0 10*3/uL (ref 0.0–0.1)
Basophil %: 0.9 %
Calcium, Total: 8.2 mg/dL — ABNORMAL LOW (ref 8.5–10.1)
Chloride: 105 mmol/L (ref 98–107)
Co2: 28 mmol/L (ref 21–32)
Creatinine: 0.86 mg/dL (ref 0.60–1.30)
Eosinophil #: 0.1 10*3/uL (ref 0.0–0.7)
Eosinophil %: 1.9 %
Glucose: 91 mg/dL (ref 65–99)
Lymphocyte %: 51.5 %
MCHC: 33.5 g/dL (ref 32.0–36.0)
Monocyte #: 0.3 x10 3/mm (ref 0.2–0.9)
Neutrophil #: 1.4 10*3/uL (ref 1.4–6.5)
Neutrophil %: 36.6 %
Osmolality: 273 (ref 275–301)
Potassium: 3.6 mmol/L (ref 3.5–5.1)
SGPT (ALT): 19 U/L (ref 12–78)
Sodium: 138 mmol/L (ref 136–145)
Thyroid Stimulating Horm: 1.34 u[IU]/mL
Total Protein: 6.9 g/dL (ref 6.4–8.2)

## 2012-09-09 LAB — URINALYSIS, COMPLETE
Bacteria: NONE SEEN
Bilirubin,UR: NEGATIVE
Ketone: NEGATIVE
Leukocyte Esterase: NEGATIVE
Nitrite: NEGATIVE
Ph: 7 (ref 4.5–8.0)

## 2012-10-16 ENCOUNTER — Emergency Department: Payer: Self-pay | Admitting: Emergency Medicine

## 2012-10-16 LAB — URINALYSIS, COMPLETE
Bacteria: NONE SEEN
Blood: NEGATIVE
Ketone: NEGATIVE
Protein: NEGATIVE
RBC,UR: 1 /HPF (ref 0–5)
Specific Gravity: 1.015 (ref 1.003–1.030)
Squamous Epithelial: 5
WBC UR: 3 /HPF (ref 0–5)

## 2012-10-16 LAB — DRUG SCREEN, URINE
Amphetamines, Ur Screen: NEGATIVE (ref ?–1000)
Barbiturates, Ur Screen: NEGATIVE (ref ?–200)
Cannabinoid 50 Ng, Ur ~~LOC~~: NEGATIVE (ref ?–50)
Cocaine Metabolite,Ur ~~LOC~~: NEGATIVE (ref ?–300)
Methadone, Ur Screen: NEGATIVE (ref ?–300)
Opiate, Ur Screen: NEGATIVE (ref ?–300)
Phencyclidine (PCP) Ur S: NEGATIVE (ref ?–25)

## 2012-10-16 LAB — COMPREHENSIVE METABOLIC PANEL
Albumin: 4.4 g/dL (ref 3.4–5.0)
Anion Gap: 5 — ABNORMAL LOW (ref 7–16)
Calcium, Total: 8.4 mg/dL — ABNORMAL LOW (ref 8.5–10.1)
Co2: 30 mmol/L (ref 21–32)
EGFR (African American): >60
EGFR (Non-African Amer.): >60
Glucose: 107 mg/dL — ABNORMAL HIGH (ref 65–99)
Potassium: 3.8 mmol/L (ref 3.5–5.1)
SGOT(AST): 24 U/L (ref 15–37)
Sodium: 140 mmol/L (ref 136–145)

## 2012-10-16 LAB — CBC
HGB: 14.2 g/dL (ref 12.0–16.0)
MCHC: 33.6 g/dL (ref 32.0–36.0)
WBC: 9.7 10*3/uL (ref 3.6–11.0)

## 2012-10-16 LAB — TSH: Thyroid Stimulating Horm: 1.9 u[IU]/mL

## 2012-10-16 LAB — ACETAMINOPHEN LEVEL: Acetaminophen: <2 ug/mL

## 2012-10-16 LAB — SALICYLATE LEVEL: Salicylates, Serum: <1.7 mg/dL

## 2012-10-16 LAB — ETHANOL
Ethanol %: <0.003 % (ref 0.000–0.080)
Ethanol: <3 mg/dL

## 2012-11-10 ENCOUNTER — Emergency Department: Payer: Self-pay | Admitting: Emergency Medicine

## 2012-11-10 LAB — COMPREHENSIVE METABOLIC PANEL
Albumin: 4.2 g/dL (ref 3.4–5.0)
Anion Gap: 6 — ABNORMAL LOW (ref 7–16)
Co2: 28 mmol/L (ref 21–32)
EGFR (Non-African Amer.): >60
SGOT(AST): 25 U/L (ref 15–37)

## 2012-11-10 LAB — CBC
HGB: 13.6 g/dL (ref 12.0–16.0)
MCH: 28.4 pg (ref 26.0–34.0)

## 2013-04-04 ENCOUNTER — Emergency Department: Payer: Self-pay | Admitting: Emergency Medicine

## 2013-04-05 LAB — ETHANOL
Ethanol %: 0.167 % — ABNORMAL HIGH
Ethanol: 167 mg/dL

## 2013-08-07 ENCOUNTER — Emergency Department: Payer: Self-pay | Admitting: Emergency Medicine

## 2013-08-07 LAB — COMPREHENSIVE METABOLIC PANEL
ALBUMIN: 4.2 g/dL (ref 3.4–5.0)
Alkaline Phosphatase: 76 U/L
Anion Gap: 7 (ref 7–16)
BILIRUBIN TOTAL: 0.2 mg/dL (ref 0.2–1.0)
BUN: 9 mg/dL (ref 7–18)
CALCIUM: 8.6 mg/dL (ref 8.5–10.1)
CO2: 24 mmol/L (ref 21–32)
CREATININE: 0.85 mg/dL (ref 0.60–1.30)
Chloride: 105 mmol/L (ref 98–107)
EGFR (African American): >60
Glucose: 111 mg/dL — ABNORMAL HIGH (ref 65–99)
Osmolality: 271 (ref 275–301)
POTASSIUM: 3.6 mmol/L (ref 3.5–5.1)
SGOT(AST): 32 U/L (ref 15–37)
SGPT (ALT): 40 U/L (ref 12–78)
SODIUM: 136 mmol/L (ref 136–145)
Total Protein: 7.9 g/dL (ref 6.4–8.2)

## 2013-08-07 LAB — URINALYSIS, COMPLETE
Bilirubin,UR: NEGATIVE
GLUCOSE, UR: NEGATIVE mg/dL (ref 0–75)
Ketone: NEGATIVE
LEUKOCYTE ESTERASE: NEGATIVE
Nitrite: NEGATIVE
Ph: 7 (ref 4.5–8.0)
Protein: NEGATIVE
SPECIFIC GRAVITY: 1.008 (ref 1.003–1.030)
Squamous Epithelial: 2

## 2013-08-07 LAB — CBC WITH DIFFERENTIAL/PLATELET
BASOS PCT: 0.4 %
Basophil #: 0 10*3/uL (ref 0.0–0.1)
Eosinophil #: 0.1 10*3/uL (ref 0.0–0.7)
Eosinophil %: 1.1 %
HCT: 40.4 % (ref 35.0–47.0)
HGB: 13.8 g/dL (ref 12.0–16.0)
Lymphocyte #: 3.3 10*3/uL (ref 1.0–3.6)
Lymphocyte %: 30.9 %
MCH: 29.3 pg (ref 26.0–34.0)
MCHC: 34.3 g/dL (ref 32.0–36.0)
MCV: 86 fL (ref 80–100)
Monocyte #: 0.7 x10 3/mm (ref 0.2–0.9)
Monocyte %: 6.3 %
Neutrophil #: 6.6 10*3/uL — ABNORMAL HIGH (ref 1.4–6.5)
Neutrophil %: 61.3 %
PLATELETS: 226 10*3/uL (ref 150–440)
RBC: 4.72 10*6/uL (ref 3.80–5.20)
RDW: 13.1 % (ref 11.5–14.5)
WBC: 10.8 10*3/uL (ref 3.6–11.0)

## 2013-08-08 ENCOUNTER — Emergency Department: Payer: Self-pay | Admitting: Emergency Medicine

## 2013-08-08 LAB — CBC
HCT: 40.4 % (ref 35.0–47.0)
HGB: 13.4 g/dL (ref 12.0–16.0)
MCH: 28.5 pg (ref 26.0–34.0)
MCHC: 33.2 g/dL (ref 32.0–36.0)
MCV: 86 fL (ref 80–100)
Platelet: 209 10*3/uL (ref 150–440)
RBC: 4.72 10*6/uL (ref 3.80–5.20)
RDW: 13.3 % (ref 11.5–14.5)
WBC: 5.9 10*3/uL (ref 3.6–11.0)

## 2013-08-08 LAB — HCG, QUANTITATIVE, PREGNANCY: Beta Hcg, Quant.: <1 m[IU]/mL — ABNORMAL LOW

## 2013-11-15 ENCOUNTER — Inpatient Hospital Stay: Payer: Self-pay | Admitting: Psychiatry

## 2013-11-15 LAB — URINALYSIS, COMPLETE
BLOOD: NEGATIVE
Bilirubin,UR: NEGATIVE
Glucose,UR: NEGATIVE mg/dL (ref 0–75)
KETONE: NEGATIVE
Leukocyte Esterase: NEGATIVE
NITRITE: NEGATIVE
PH: 6 (ref 4.5–8.0)
Protein: 30
SPECIFIC GRAVITY: 1.005 (ref 1.003–1.030)

## 2013-11-15 LAB — COMPREHENSIVE METABOLIC PANEL
ALBUMIN: 4.1 g/dL (ref 3.4–5.0)
ALK PHOS: 87 U/L
ALT: 29 U/L (ref 12–78)
ANION GAP: 9 (ref 7–16)
BUN: 7 mg/dL (ref 7–18)
Bilirubin,Total: 0.2 mg/dL (ref 0.2–1.0)
CHLORIDE: 106 mmol/L (ref 98–107)
Calcium, Total: 8.9 mg/dL (ref 8.5–10.1)
Co2: 25 mmol/L (ref 21–32)
Creatinine: 0.89 mg/dL (ref 0.60–1.30)
EGFR (African American): >60
EGFR (Non-African Amer.): >60
Glucose: 149 mg/dL — ABNORMAL HIGH (ref 65–99)
OSMOLALITY: 280 (ref 275–301)
POTASSIUM: 3.7 mmol/L (ref 3.5–5.1)
SGOT(AST): 30 U/L (ref 15–37)
SODIUM: 140 mmol/L (ref 136–145)
TOTAL PROTEIN: 8.1 g/dL (ref 6.4–8.2)

## 2013-11-15 LAB — DRUG SCREEN, URINE
Amphetamines, Ur Screen: NEGATIVE (ref ?–1000)
Barbiturates, Ur Screen: NEGATIVE (ref ?–200)
Benzodiazepine, Ur Scrn: NEGATIVE (ref ?–200)
CANNABINOID 50 NG, UR ~~LOC~~: NEGATIVE (ref ?–50)
Cocaine Metabolite,Ur ~~LOC~~: NEGATIVE (ref ?–300)
MDMA (ECSTASY) UR SCREEN: NEGATIVE (ref ?–500)
METHADONE, UR SCREEN: NEGATIVE (ref ?–300)
Opiate, Ur Screen: NEGATIVE (ref ?–300)
Phencyclidine (PCP) Ur S: NEGATIVE (ref ?–25)
Tricyclic, Ur Screen: NEGATIVE (ref ?–1000)

## 2013-11-15 LAB — SALICYLATE LEVEL: Salicylates, Serum: <1.7 mg/dL

## 2013-11-15 LAB — CBC
HCT: 44.8 % (ref 35.0–47.0)
HGB: 15.2 g/dL (ref 12.0–16.0)
MCH: 28.8 pg (ref 26.0–34.0)
MCHC: 33.9 g/dL (ref 32.0–36.0)
MCV: 85 fL (ref 80–100)
Platelet: 262 10*3/uL (ref 150–440)
RBC: 5.27 10*6/uL — ABNORMAL HIGH (ref 3.80–5.20)
RDW: 12.8 % (ref 11.5–14.5)
WBC: 11.7 10*3/uL — ABNORMAL HIGH (ref 3.6–11.0)

## 2013-11-15 LAB — ETHANOL
ETHANOL LVL: 160 mg/dL
Ethanol %: 0.16 % — ABNORMAL HIGH (ref 0.000–0.080)

## 2013-11-15 LAB — ACETAMINOPHEN LEVEL: Acetaminophen: <2 ug/mL

## 2013-12-27 ENCOUNTER — Emergency Department: Payer: Self-pay | Admitting: Emergency Medicine

## 2014-07-15 ENCOUNTER — Emergency Department: Payer: Self-pay | Admitting: Emergency Medicine

## 2014-08-13 ENCOUNTER — Emergency Department
Admit: 2014-08-13 | Disposition: A | Discharge status: Home / Self Care | Payer: Self-pay | Admitting: Emergency Medicine

## 2014-09-02 NOTE — Consult Note (Signed)
PATIENT NAME:  Kelly Peterson, BALSAM MR#:  [id number] DATE OF BIRTH:  1980-08-23  DATE OF CONSULTATION:  04/12/2012  REFERRING PHYSICIAN:   CONSULTING PHYSICIAN:  Lurline Del, MD  REASON FOR CONSULTATION: Abdominal pain and melena.   HISTORY OF PRESENT ILLNESS: This is a 34 year old female with history of pelvic inflammatory disease according to her for some period of time. The patient also probably has history of endometriosis. According to her, she has had abdominal and pelvic pain for a long time which has gotten really worse within the last four days. Apparently she had been to the Emergency Room on more than one occasion with this lower abdominal and pelvic pain and was initially prescribed antibiotics and finally Toradol. She has been using multiple pills of Aleve everyday as well as Toradol. Yesterday the pain was severe and she also started to have pain in the upper abdominal area. She went to take a shower and almost passed out followed by dark black liquidy stool. She has had multiple episodes of black tarry stools since then. The last one was less than an hour ago. She came to the hospital with a hemoglobin of 9. Her hemoglobin a few days ago was 14 and this morning her hemoglobin has dropped down to 6.7. The patient also had some nausea and emesis which was reported as brown but no fresh blood was noted. The patient was tachycardic on admission and remains tachycardic. Her blood pressure is fine. She denies any prior history of GI bleed and is mainly complaining of pain in the lower abdomen as well as epigastric area.   PAST MEDICAL HISTORY:  1. Gravida 7 para 4, two premature births. 2. History of irritable bowel syndrome. 3. History of left ovarian cyst. 4. Anxiety.  5. Endometriosis. 6. Kidney stones. 7. According to her, chronic pelvic inflammatory disease.   PAST SURGICAL HISTORY:  1. Colonoscopy. 2. D and C in the past.   ALLERGIES: Morphine.   MEDICATIONS AT HOME:   1. Flagyl. 2. Aleve. 3. Toradol. 4. Clonazepam. 5. Celexa. 6. Doxycycline.   FAMILY HISTORY: Positive for diabetes.   SOCIAL HISTORY: She lives at home. Denies using tobacco, alcohol, or illicit drugs.   REVIEW OF SYSTEMS: Negative except for what is mentioned in the history of present illness.    PHYSICAL EXAMINATION:   GENERAL: The patient appeared to be in moderate distress secondary to abdominal pain. She also appears to be quite anxious.   VITAL SIGNS: Temperature 99, heart rate is about 120, respirations 18, blood pressure 102/67.   HEENT: Unremarkable. She appears pale. No jaundice was noted.   LUNGS: Grossly clear to auscultation bilaterally with fair air entry and normal breath sounds.   CARDIOVASCULAR: Tachycardia, regular rate and rhythm. No gallops or murmur.   ABDOMEN: Some epigastric tenderness as well as some tenderness in the suprapubic and left lower quadrant area. There is no rebound or guarding. Bowel sounds are normal.   EXTREMITIES: No edema, clubbing, or cyanosis.   NEUROLOGIC: Appears to be unremarkable.   LABORATORY, DIAGNOSTIC, AND RADIOLOGICAL DATA: Most recent hemoglobin is 6.7, white cell count is 7.8, hematocrit 19.4, platelet count 139. Hemoglobin on admission was 9 with a white cell count of 11.5. Urine culture is negative. Urinalysis grossly unremarkable except for some blood.  Nuclear Medicine scan done this morning no significant active GI bleeding.   ASSESSMENT AND PLAN: The patient is with what appears to be pelvic inflammatory disease as well as an ovarian cyst causing lower  abdominal and pelvic pain. The patient has recently been treated. The patient has been treated with antibiotics as well as nonsteroidals. The patient is now presenting with melena and a significant drop in her hemoglobin and hematocrit as well as clinical signs of blood loss such as tachycardia. We are probably dealing with an upper GI bleed secondary to an acute peptic  ulcer disease caused by nonsteroidals. The patient seems to be hemodynamically stable, although she continues to be tachycardic. The patient has been started on IV PPI infusion and is currently receiving her first unit of packed RBCs. Agree with current management. I have asked the nurse to increase the rate of packed RBC transfusion. Repeat hemoglobin and hematocrit after the second unit of packed RBC. Meanwhile, we will make preparation for an urgent upper GI endoscopy for further evaluation and control of bleeding.   The procedure and has been discussed with her in detail along with anesthesia and sedation. She is in full agreement. Further recommendations to follow.   ____________________________ Lurline DelShaukat Desiraye Rolfson, MD si:drc D: 04/12/2012 09:24:57 ET T: 04/12/2012 11:19:21 ET JOB#: [id number]  cc: Lurline DelShaukat Kateleen Encarnacion, MD, <Dictator> Lurline DelSHAUKAT Allissa Albright MD ELECTRONICALLY SIGNED 04/13/2012 04:5420:04

## 2014-09-02 NOTE — Discharge Summary (Signed)
PATIENT NAME:  Kelly Peterson, [name] MR#:  [id number] DATE OF BIRTH:  [birth year]  DATE OF ADMISSION:  04/11/2012 DATE OF DISCHARGE:  04/13/2012  DISCHARGE DIAGNOSES:  1. Acute blood loss anemia.  2. Gastric ulcers.  3. Pelvic inflammatory disease.    CONSULTANT: Dr. Niel HummerIftikhar.   PROCEDURES: EGD which showed a clean-based gastric ulcer and nonbleeding erosive gastropathy, normal duodenum.   ADMITTING HISTORY AND PHYSICAL/HOSPITAL COURSE: Please see detailed history and physical dictated on 04/11/2012. In brief 34 year old female patient with history of endometriosis, recent diagnosis of pelvic inflammatory disease on two days of antibiotics having been seen in the ER, started on ketorolac, presented to the Emergency Room complaining of melena and abdominal pain. Patient's hemoglobin dropped from normal to a level of 6.2, received 2 units of packed RBCs on the floor. Patient had an EGD which showed a clean base gastric ulcer and nonbleeding erosive gastropathy which was thought to be the cause for melena. Patient's hemoglobin was stable for over 24 hours after transfusion of blood. She is tolerating her food and is being discharged home in fair condition.   Patient does have lower abdominal pain secondary to pelvic inflammatory disease. Needs 14 days of antibiotics, has finished four day course, will be continued for another 10 days of Levaquin and Flagyl. She does have follow [facility] the pelvic inflammatory disease.   On the day of discharge, patient's blood pressure is 112/60, pulse of 90. Abdominal examination shows mild tenderness in the lower abdomen with no rigidity, guarding. Bowel sounds present. No further melena and patient is being discharged home.   DISCHARGE MEDICATIONS:  1. Protonix 40 mg oral twice a day.  2. Ferrous sulfate 325 mg oral twice a day with meals.  3. Lorazepam 1 mg oral 3 times a day.  4. Celexa 20 mg oral once a day. 5. Flagyl 500  mg oral every eight hours for 10 days.  6. Levaquin 500 mg oral once a day for 10 days.  7. Percocet 325/5, 1 tablet oral 4 times a day as needed for pain, 20 pills given.   NOTE: Aleve and ketorolac stopped.   DISCHARGE INSTRUCTIONS: Continue with regular diet. Watch out for any further bleeding or melena and call your doctor or return to Emergency Room if this happens. [facility] as scheduled in four days. No restriction in activity. This plan was discussed with the patient who has verbalized understanding and is okay with the plan.   TIME SPENT: Time spent today on this discharge dictation and coordination of care was 45 minutes.  ____________________________ Molinda BailiffSrikar R. Yannet Rincon, MD srs:cms D: 04/13/2012 10:31:16 ET T: 04/13/2012 10:43:45 ET JOB#: [id number]  cc: Wardell HeathSrikar R. Nikyah Lackman, MD, <Dictator> [facility]  Orie FishermanSRIKAR R Daviona Herbert MD ELECTRONICALLY SIGNED 04/27/2012 11:07

## 2014-09-02 NOTE — H&P (Signed)
PATIENT NAME:  Kelly Peterson, TOPPINS MR#:  [id number] DATE OF BIRTH:  17-Sep-1980  DATE OF ADMISSION:  04/11/2012  CHIEF COMPLAINT: Abdominal pain and melena.   HISTORY OF PRESENT ILLNESS: The patient is a 34 year old female with history of endometriosis and recent diagnosis of pelvic inflammatory disease two days ago who presents with progressive abdominal pain and new onset melena today. The patient has been evaluated daily over the preceding four days in the Emergency Department and discharged to home with abdominal pain. She cannot identify any exacerbating or alleviating factors. Her work-up included a pelvic ultrasound which showed concern for a hypoechoic left ovarian mass which may represent a small hemorrhagic cyst versus endometrioma. She was empirically started on antibiotics for treatment of pelvic inflammatory disease with doxycycline and Flagyl. Due to persistent pain over this period of time, the patient has been taking Aleve. She admits to about 18 tablets of Aleve of 220 mg dosage over two days and on the day of presentation she had also taken three tablets of Toradol as had been prescribed for her when she was discharged from the Emergency Department. Today this afternoon she noted black stools with emesis characterized with some brown small substances, about three episodes. She notes that she felt significantly lightheaded today and felt like she was almost going to pass out about three times. She had some associated chills. She also has abdominal pain that persists located in bilateral lower quadrants. It is 7 out of 10, burning sensation that is nonradiating in nature. She has been on antibiotics for about two days, doxycycline and Flagyl, for a day.   Per evaluation in the Emergency Department, she was noted to be tachycardic with heart rates in the 120's to 140's with sinus tachycardia. She was Hemoccult positive from below and had an acute hemoglobin drop within the last 24 hours from 13  on her last admission to 9. This was subsequently repeated actually and it further dropped to 8 so she is being admitted for active GI bleed.   Of note, the patient has a history of irritable bowel syndrome. She had a colonoscopy a few years ago that was negative.   PAST MEDICAL HISTORY:  1. Gravida 7, para 4, 2 premature birth. No complications with any of her pregnancies.  2. Irritable bowel syndrome.  3. Ovarian cyst.  4. Anxiety.  5. Endometriosis.  6. Kidney stones.   PAST SURGICAL HISTORY:  1. Colonoscopy. 2. D and C.   ALLERGIES: Morphine causes a rash.   MEDICATIONS:  1. Flagyl 500 mg every eight hours for 14 days.  2. Aleve 220 mg. 3. Ketorolac 10 mg 3 times a day.  4. Clonazepam 1 mg 3 times a day.  5. Celexa 20 mg 1 tab a day.  6. Doxycycline.   FAMILY HISTORY: Mother has diabetes. Father had skin cancer. No family history of CVA, CAD, or other cancers. She has siblings that are healthy.   SOCIAL HISTORY: Lives at home. Denies tobacco, alcohol, or illicit drug use.   REVIEW OF SYSTEMS: Denies frank fevers, chills, fatigue, weight loss or weight gain. EYES: Denies blurred vision, double vision, or eye pain. ENT: Denies tinnitus, ear pain. CARDIOVASCULAR: Denies chest pain, shortness of breath. Admits to lightheadedness. PULMONARY: Denies cough, wheezing. ABDOMEN: Admits to abdominal pain, melena. No frank hematochezia. Admits to emesis. GU: Notes that she is being treated for PID. Has vaginal spotting but is not actively on her menstrual period. Denies frequency, urgency, or dysuria. SKIN: No  new skin rashes. PSYCH: Has anxiety. MUSCULOSKELETAL: Denies myalgias, arthralgias, or any joint swelling.   PHYSICAL EXAMINATION:   VITAL SIGNS: Afebrile, blood pressure 117/60, heart rate 108 to 130's, respirations 18, sats 98% on room air.   GENERAL: The patient is in mild distress. She appears anxious. No respiratory distress.   EYES: Pupils equal, round, reactive to light and  accommodation. Anicteric sclerae. Pink conjunctivae.   ENT: Normal external ears and nares. Posterior oropharynx is clear without exudate or thrush.   CARDIOVASCULAR: Tachycardic, S1 and S2. No pretibial edema noted.   PULMONARY: Clear to auscultation bilaterally. No wheezes, rales, or rhonchi. Normal effort. The patient is speaking in full sentences.   ABDOMEN: Tenderness in the left upper quadrant as well as bilateral lower quadrants on mild palpation. There is normal bowel sounds. The patient is Hemoccult positive. No organomegaly appreciated.   MUSCULOSKELETAL: Full range of motion of all extremities. No clubbing. No cyanosis.   PSYCH: The patient is awake, alert, and oriented x3. Judgment and insight intact.   SKIN: Normal color. Warm and dry. No lesions identified.   LABORATORY DATA: Urinalysis shows 3+ blood, 2+ leukocyte esterase, 2 red blood cells per high-power field, 4 white blood cells per high-power field, trace bacteria, 11 epithelial cells per high-power field. Urine drug screen is positive for benzodiazepines only. CBC shows WBC count 11.5, hemoglobin 9 down from 13.3 on November 26th, hematocrit of 27 with a platelet count of 197, MCV of 85. Repeat about an hour later showed white count 12.9, hemoglobin 8.2, hematocrit 24.4, platelet count 174. Glucose 130, BUN 34, creatinine 0.77, sodium 139, potassium 4.1, chloride 109, bicarb 23, calcium 7.9, bilirubin 0.2, alkaline phosphatase 55, ALT 19, AST 18, total protein 6.3, albumin 3.6, osmolality 287 with an anion gap of 7 and a GFR greater than 60. Lipase is 142. Beta-HCG serum is less than 1 which means she is not pregnant. Ethanol level is less than 0.003%. ABO group is O+. Antibody screen is negative.   Previous studies showed GC/Chlamydia amplification test was negative. Wet prep on November 26th showed moderate white blood cells seen with clue cells. No yeast. No trichomonas. No spermatozoa seen.   ASSESSMENT AND PLAN: This is  a 34 year old female presenting with acute anemia, melena, tachycardia, elevated BUN consistent with active upper GI bleed most likely probably as a result of increased NSAID use over the last four days due to ongoing abdominal pain.  1. Acute upper GI bleed. The patient will get a Nuclear Medicine bleeding scan at this time. Further recommendations will be determined after that study. Will place a GI consult. She has been started on a Protonix drip. Will hold all NSAIDS at this time. Will place an NG for lavage. 18-gauge IV is available since the patient has 18-gauge IV in the event that massive transfusion is required.  2. Acute anemia of blood loss. This is again most likely due to upper GI bleed. We are going to transfuse the patient now with repeat CBCs every six hours.  3. Abdominal pain most likely due to PID. Will continue her antibiotics empirically via IV as she is n.p.o. Continue doxycycline and cefoxitin at this time. She can transition back to her p.o. Flagyl and doxycycline for a total of 14 days when she is discharged.  4. Anxiety. Anxiolytics as needed. 5. Prophylaxis. Pneumatic compression boots. Hold anticoagulants due to active bleeding.   DISPOSITION: The patient is being admitted inpatient for management of severe GI bleeding. I  expect the patient will require more than two nights.   CODE STATUS: The patient is a FULL CODE. Surrogate decision maker is her significant other/boyfriend, Dustin O'Daniel.  TIME SPENT COORDINATING ADMISSION: 60 minutes.   ____________________________ Aurther LoftAdaorah E. Neeya Prigmore, DO aeo:drc D: 04/12/2012 00:43:01 ET T: 04/12/2012 08:18:59 ET JOB#: [id number]  cc: Aurther LoftAdaorah E. Jakhia Buxton, DO, <Dictator> Magenta Schmiesing E Patrcia Schnepp DO ELECTRONICALLY SIGNED 04/15/2012 2:07

## 2014-09-02 NOTE — Consult Note (Signed)
Brief Consult Note: Diagnosis: Melena and anemia most likely acute upper GI bleed from PUD.   Patient was seen by consultant.   Consult note dictated.   Orders entered.   Comments: Recommendations: Transfuse 2 units PRBC's. Continue IV PPI infusion. EGD now. Procedure discussed with her in detail and she is in full agreement. Further recommendations to follow.  Electronic Signatures: Lurline DelIftikhar, Terre Hanneman (MD)  (Signed [phone]-Nov-13 09:17)  Authored: Brief Consult Note   Last Updated: 28-Nov-13 09:17 by Lurline DelIftikhar, Anvi Mangal (MD)

## 2014-09-02 NOTE — Consult Note (Signed)
Chief Complaint:   Subjective/Chief Complaint EGD done. One clean based gastric ulcer. No blood, bleeding or stigmata.  Recommendations: Continue PPI infusion. Clear liquid diet. Follow H and H. Will follow.   Electronic Signatures: Lurline DelIftikhar, Pansey Pinheiro (MD)  (Signed [phone]-Nov-13 10:54)  Authored: Chief Complaint   Last Updated: 28-Nov-13 10:54 by Lurline DelIftikhar, Jachob Mcclean (MD)

## 2014-09-05 NOTE — H&P (Signed)
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  The patient is single, never married, and currently lives with her 75-year-old son's father, who is 76 years old and works in Holiday [employer]. All three of them live in a house. The patient comes for inpatient hospital psychiatry at [facility] with the chief complaint, "I am on Klonopin. I take too many pills, and take more than I am supposed to and I am prescribed. I have been having problems with meds and  pills and medications for quite some time."  HISTORY OF PRESENT ILLNESS:  When the patient was asked when she last felt well, she reported that she had been having problems with drugs since she was 34 years old. Her main drug abuse has been Klonopin. In addition, recently she has been snorting cocaine. Alcohol has never been a problem. She smokes THC occasionally. She has been taking more Klonopin than prescribed, and getting it from the street, in addition to what Dr. Suzie Portela has been prescribing her.   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 Was inpatient for 7 days at [facility]. No history of suicide attempt. Being followed by Dr. Suzie Portela. Last appointment was 08/22/2012. Next appointment coming up in 2 months.  FAMILY HISTORY OF MENTAL ILLNESS:  Father has bipolar disorder. No known history of suicides in the family.  FAMILY HISTORY:  Raised by father. Father works in [employer]. Father is living and does not keep up with  him. Mother died of breast cancer. Had one brother who died in a car wreck. Not in touch with father; [place], [place].  PERSONAL HISTORY:  Born in [place]. Dropped out in 11th grade because of anxiety. Got GED later. Took one month of college courses at [employer]. Worked her first job as at age 83 years. This job lasted "can't remember." Longest job she ever worked was at [employer]. She worked only in [employer] as a Conservation officer, nature. Last worked a few months ago, and quit because of babysitter problems.  MARRIAGES: Was never married. Has 2 children, a 3-year-old girl; she is adopted. Has a 78-year-old son who lives with her, and they both live with the son's father, with whom she always has arguments about her drug abuse problems. ALCOHOL AND DRUGS: First encounter at 34 years old. No problem with alcohol drinking. No history of DWI or public drunkenness. Does admit to smoking THC on occasional basis. Admits that she had been using cocaine and snorting it. Used it for some time, and then quit, and had been sober for 7 years. Recently she started using it because she was going around with friends. She was prescribed Klonopin from Dr. Suzie Portela; he prescribed 1 mg 3 times a day. She gets it from the street, and takes more than she is supposed to as prescribed. Denies smoking nicotine cigarettes.  PAST MEDICAL HISTORY:  No known high blood pressure. No known diabetes mellitus.  No major surgery. No major injuries. No history  of motor vehicle accident or being unconscious.   ALLERGIES: MORPHINE.  Not being followed by any physician, and goes to the Emergency Room as needed.   PHYSICAL-EXAMINATION: VITAL SIGNS: Temperature 97.1, pulse is 80 per minute and regular, respirations are 20 per minute and regular, blood pressure is 120/60 mmHg. HEENT: Head is normocephalic, atraumatic. Eyes: PERRLA. Fundi are bilaterally benign.  EOMs are intact.  Tympanic membranes:  No exudate.  NECK: Supple without  any lymphadenopathy or thyromegaly. CHEST:  Normal expansion,  normal breath sounds. HEART:  Normal without any murmurs or rubs. ABDOMEN:  Soft. No organomegaly. Bowel sounds heard. RECTAL:  Deferred. NEUROLOGIC: Gait is normal. Romberg is negative. Cranial nerves II to XII grossly intact. DTRs 2+, normal. Plantars are normal.   MENTAL STATUS EXAMINATION:  The patient is dressed in street clothes. Alert and oriented to place, person, time. Fully aware of situation that brought her for [facility]. Affect is flat, with mood depressed. Admits feeling hopeless and helpless. Admits feeling worthless and useless. Denies any ideas or plans to hurt herself or others, wants to get help for her drug abuse problems, and is eager to get help. No psychosis. Denies auditory or visual hallucinations .Marland Kitchen. Memory is intact. Cognition intact. General knowledge of information fair. She could spell the word 'world' forward and backward without any problem. Memory and recall are good. She could count money. Appetite is good; however, sleep is disturbed and not really good. Insight and judgment guarded.  IMPRESSIONS:  AXIS I: Polysubstance abuse/dependence, benzodiazepines, THC, cocaine. Substance-induced mood disorder. Substance-induced anxiety. AXIS II:  Deferred. AXIS III:  None major. AXIS IV:  Severe. Polysubstance dependence and abuse, as stated above. Is not employed. Financial. Occupational. Lives with son's father, and constant arguments with him about her drug abuse. AXIS V:  Global Assessment of Functioning of 25.  PLAN:  The patient admitted [facility] for close observation and management. She will be started on detox protocol, and she will be given medications on p.r.n. basis to help her come off the benzodiazepines.  During her stay in the hospital, she will be given mileu. She will take part in individual and group therapy where substance abuse issues will be addressed. At the time of  discharge, appropriate followup appointment will be made with Dr. Suzie PortelaMoffitt, and her benzodiazepine abuse and cocaine abuse will be addressed.   ____________________________ Jannet MantisSurya K. Guss Bundehalla, MD skc:mr D: 09/02/2012 18:40:00 ET T: 09/02/2012 19:02:22 ET JOB#: [id number]  cc: Monika SalkSurya K. Guss Bundehalla, MD, <Dictator> Beau FannySURYA K Liyah Higham MD ELECTRONICALLY SIGNED 09/03/2012 16:39

## 2014-09-05 NOTE — Discharge Summary (Signed)
PATIENT NAME:  Kelly Peterson, Kelly Peterson MR#:  [id number] DATE OF BIRTH:  26-Oct-1980  DATE OF ADMISSION:  09/02/2012 DATE OF DISCHARGE:  09/13/2012  HOSPITAL COURSE: See dictated history and physical for details of admission.   This 34 year old woman with a history of anxiety and substance abuse presented to the Emergency Room, having overdosed on alcohol and clonazepam, with symptoms of severe anxiety and overuse of prescription medicines. The patient was initially showing severe signs of substance withdrawal. She was very anxious, tremulous, at times almost disorganized in her thinking. She responded to treatment with benzodiazepines for withdrawal symptoms but she continued to have significant pain all over her body, feelings of anxiety, jitteriness. At times she appeared to be having symptoms that could be consistent with opiate withdrawal, but she consistently denied any opiate use and her drug screen was not positive for it. She required many days of extended treatment with gradually decreasing doses of Librium for benzodiazepine withdrawal.   Gradually, as she became more functional and was able to get up out of bed she did participate in groups and individual therapy. Her mental state became more lucid. She was able to discuss her motivation to stay off of substances in the future. As far as her current anxiety symptoms, as opposed to detox she was treated with citalopram which is currently at 30 mg a day, and buspirone, currently at 10 mg 3 times a day, and has also been taking trazodone 100 mg at night. She tolerated medications well, without any significant side-effects. She has not shown any suicidal behavior in the hospital. At this point she has been taken completely off of all of the Librium and her vital signs are now stable. She is not looking tremulous. She is lucid and able to engage in appropriate conversation. She seems to be largely through with the worst of the detox. She has been educated  about sedative abuse, dependence and withdrawal and understands the risks, and is agreeable to engaging in outpatient treatment to avoid relapse. At this point she is being discharged back home, where she lives with her boyfriend. She will be following up with [facility] for substance abuse and mental health treatment. She is free to go back and see [facility] Marg[facility]ett [facility] and discuss outpatient treatment, but at this point going to [facility] is probably her better option.   DISCHARGE MEDICATIONS: Citalopram 30 mg p.o. daily, buspirone 10 mg p.o. 3 times a day, trazodone 100 mg at night.   LABORATORY RESULTS: Admission labs included a chemistry panel normal, except for a slightly low calcium. CBC normal. Urinalysis unremarkable.   Drug screen on presentation positive for cocaine and benzodiazepines. TSH normal at 1.1. Alcohol not detected. At the time she first presented she had a slightly low potassium at 3.2, which normalized.   MENTAL STATUS EXAM AT DISCHARGE: Neatly-dressed and groomed woman, looks her stated age, cooperative with the interview. Good eye contact. Normal psychomotor activity. Speech normal in rate, tone and volume. Affect slightly constricted, but not unusual. Appropriate overall situation. Mood stated as good. Thoughts appear lucid, and no sign of loosening  associations or delusions. Expresses optimism about the future. Denies suicidal or homicidal ideation. Shows improved insight and judgment. Normal intelligence.   DISPOSITION: Discharged back home.   FOLLOWUP: With [facility].   DIAGNOSES, PRINCIPAL AND PRIMARY:  AXIS I: Sedative dependence.   SECONDARY DIAGNOSES: AXIS I:  1.  Cocaine abuse.  2.  Generalized anxiety disorder.  3.  Dysthymia.  AXIS II: Deferred.   AXIS III: Sustained benzodiazepine withdrawal, history of Crohn's disease, currently quiet.   AXIS IV: Severe from multiple stress and single parenthood, financial stress, recent substance detox.   AXIS V:  Functioning at time of discharge:    ____________________________ Audery Amel, MD jtc:dm D: 09/13/2012 11:39:30 ET T: 09/13/2012 12:06:22 ET JOB#: [id number]  cc: Audery Amel, MD, <Dictator> Audery Amel MD ELECTRONICALLY SIGNED 09/13/2012 16:23

## 2014-09-05 NOTE — Consult Note (Signed)
PATIENT NAME:  Kelly Peterson, Kelly L MR#:  [id number] DATE OF BIRTH:  Oct 26, 1980  DATE OF CONSULTATION:  09/07/2012  REFERRING PHYSICIAN:      Mordecai RasmussenJohn Clapacs, MD CONSULTING PHYSICIAN:  Shaune PollackQing Xander Jutras, MD  REASON FOR CONSULTATION:  Nausea, vomiting and feeding sick.  REVIEW OF HISTORY OF PRESENT ILLNESS:  The patient is a 34 year old Caucasian female with a past medical history of irritable bowel syndrome, ovarian cyst, anxiety, endometriosis, kidney stone and was admitted for polysubstance abuse with a detox.  The patient was taken off detox since yesterday. She started to have generalized body pain and nausea or vomiting once. She said that she feels very uncomfortable, but not sick.  She denies any fever or chills. No headache or dizziness. No chest pain, palpitation, orthopnea, or nocturnal dyspnea. No leg edema. The patient denies any abdominal pain and only has nausea and vomiting once. No diarrhea, melena or bloody stool. No dysuria, hematuria, or incontinence.  SKIN: No rash or jaundice.  NEUROLOGIC:  No headache, dizziness, loss of consciousness or seizure.  GASTROINTESTINAL:  The patient has a history of PUD, but no active bleeding.  No abdominal pain.  The patient denies any other symptoms.   PAST MEDICAL HISTORY:  PUD, irritable bowel syndrome, ovarian cyst, anxiety, endometriosis, kidney stones, PID.  PAST SURGICAL HISTORY:  D and C, colonoscopy.  FAMILY HISTORY:  Mother had diabetes. Father has skin cancer.  REVIEW OF SYSTEMS:  As mentioned above.  ALLERGIES: MORPHINE.   HOME MEDICATIONS:  Trazodone 100 mg p.o. once a day at bedtime, clonazepam 1 mg p.o. t.i.d., citalopram 40 mg p.o. daily.   PHYSICAL EXAMINATION: VITAL SIGNS: Temperature 98.2, blood pressure 104/60.   GENERAL: The patient is alert, awake, oriented, in no acute distress.  HEENT: Pupils are round, equal and react to light and accommodation.  Moist oral mucosa. Clear pharynx. NECK: Supple. No JVD or carotid bruits. No  thyromegaly.  CARDIOVASCULAR: S1, S2 regular rate and rhythm. No murmurs or gallops.  PULMONARY: Bilateral air entry. No wheezing or rales. No use of accessory muscles to breathe.  ABDOMEN: Soft. No distention. No organomegaly. Bowel sounds present. No distention. No tenderness.  EXTREMITIES: No edema, clubbing or cyanosis. No calf tenderness. Strong bilateral pedal pulses. SKIN: No rash or jaundice.  NEUROLOGY: Alert and oriented x 3. No focal deficit. Answers. No focal deficit.  Positive answers.  No focal deficits.  Power 5/5. Sensation intact.   LABORATORY DATA: CBC normal. Glucose 91, BUN 7, creatinine 0.86. Electrolytes are normal. TSH 1.4, in normal range.   IMPRESSION: 1.  Possible benzodiazepine withdrawal.  2.  No active medical issues. 3. History of PUD, PID, IBS.  RECOMMENDATIONS:  Continue  detox protocol. Thank you for the consult.  We will sign off.    ____________________________ Shaune PollackQing Kailene Steinhart, MD qc:ct D: 09/07/2012 13:51:31 ET T: 09/07/2012 14:07:15 ET JOB#: [id number]  cc: Shaune PollackQing Manjot Hinks, MD, <Dictator> Audery AmelJohn T. Clapacs, MD Shaune PollackQING Tavien Chestnut MD ELECTRONICALLY SIGNED 09/07/2012 14:57

## 2014-09-06 NOTE — H&P (Signed)
PATIENT NAME:  Kelly Peterson, Kelly Peterson MR#:  [id number] DATE OF BIRTH:  [birth year]  DATE OF ADMISSION:  11/15/2013  SEX: Female.  RACE: White.  IDENTIFYING INFORMATION: The patient is a 34 year old white female not employed and last worked 4 months ago at [employer] and quit the job to take care of her son. The patient was living with her 54-year-old son at his son's dad's house and had been living in the situation for 1 year and she got upset when her son's dad told her last night that he has a new girlfriend and then she decided to move in with her maternal grandmother who is in her 63s and agreed to take her in her house. However, meanwhile, patient got upset and she tore up his apartment and he called for help and the law enforcement came and arrested her and brought her here.  PAST PSYCHIATRIC HISTORY: History of inpatient hospitalization on psychiatry 3 times before so far. Longest period of inpatient was for 3 weeks at [facility]. History of suicide attempt once when she overdosed on chemicals was being followed at [facility] by Dr. Carman Ching and last appointment was some time ago as she was drinking alcohol and did not keep up her followup appointments. Last discharge from [facility] on 09/13/2012 on the following medications: Celexa 30 mg daily, BuSpar 10 mg p.o. 3 times a day, and trazodone 100 mg at bedtime. The patient reports that she has not been taking these medications as prescribed. History of postpartum depression after she had her first child in 2006. Was inpatient 7 days [facility].   FAMILY HISTORY OF MENTAL ILLNESS: Father has bipolar disorder, no known history of suicides in the family.   FAMILY HISTORY: Raised by father. Father works in [employer]. Father is living and does not keep up with him. Mother died of breast cancer, has 1 brother who died in a car wreck. Not in touch with father who lives in [place], [place].  PERSONAL HISTORY: Born at  [facility]. Dropped  out  in11th grade because of anxiety. Got GED later. Took 1 month of college courses, [employer], started her first job at age 62 years. This job lasted "can't remember". Longest job that she ever worked for was at [employer]. 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 Last worked 4 months ago at [employer] and quit to take care of her 46-year-old son.   MARRIAGE: Never married. Had 2 children. One is an 70-year-old girl and she is adopted, has a 66-year-old son that lives with her and plans to move in with him with her maternal grandmother.  ALCOHOL AND DRUGS: First encounter for alcohol at 18 years. No problems with alcohol drinking. No history of DWI or public drunkenness. Recently she started drinking alcohol at the rate of 7 to 8 Bootleggers a day, 12% proof alcohol and in addition, taking several shots of vodka on top of it. This has been going on for 7 months but no history of DTs, tremors, or DWIs. Admits that she used cocaine in the past but had been sober for many years. Does admit smoking THC, started smoking about 2 weeks, a joint a day and she is not going to smoke anymore and she said she just started it for only 2 weeks. Denies smoking nicotine cigarettes.  MEDICAL HISTORY: No known H/O HTN and no known  H.O diabetes mellitus, no major surgery, no major injuries, no history of ever being unconscious.  ALLERGIC: MORPHINE.   Not being followed by any physician. Goes to Emergency Room as needed.  PHYSICAL EXAMINATION: VITALS SIGNS: Temperature is 98.2, pulse is 76 per minute regular, respirations 25 per minute and regular. Blood pressure is 124/60 mmHg.  HEENT: Head is normocephalic, atraumatic. Eyes: PERRLA, fundi are benign. NECK: Supple without any lymphadenopathy or thyromegaly. CHEST: Normal expansion, normal breath sounds heard.  HEART: Normal S1 without any murmurs or rubs. ABDOMEN: Soft, no organomegaly. Bowel sounds heard. RECTAL: Deferred. PELVIC:  Deferred. NEUROLOGIC: Gait is normal. Romberg is negative. Cranial nerves II through XII are grossly intact. DTRs are 2+ normal. Plantars are normal    MENTAL STATUS EXAMINATION: The patient is dressed in hospital clothes. Alert and oriented to place, person, and fully aware of situation that brought her admission to [facility]. Affect is flat. Mood restricted. Admits feeling depressed. Admits feeling hopeless and helpless, admits feeling worthless and useless. Does have suicidal wishes and thoughts but contracts for safety. No psychosis. Denies auditory or visual hallucinations . Denies hearing voices and denies paranoid ideas or  thought control. Memory is intact. Cognition is intact. General knowledge of information is fair. Could spell the word world forward and backward without any problems. Memory and recall are good. Could count money. Appetite is fair. Does admit to sleep disturbance recently because of not taking medications and stressors of life. Insight and judgment are guarded. Impulse control poor.   IMPRESSION:  AXIS I: Polysubstance abuse/dependence, history of benzodiazepine abuse, THC abuse, cocaine abuse and dependence in remission. Alcohol abuse and dependence chronic, continuous, substance-induced anxiety. Substance-induced mood disorder.   AXIS II: Deferred.   AXIS III: None major.   AXIS IV: Severe. Polysubstance abuse dependence as stated above, financial, occupational, and problems with living situation. She had conflicts with son's father who told her last night that he had a new girlfriend and they will be moving with her maternal grandmother.   AXIS V: GAF at the time of admission 25.  PLAN: Patient [facility] for close observation evaluation and help.Marland Kitchen. She will be started back on her antidepressant medications and is on CIWA protocol. During the stay in the hospital, she will began milieu therapy and supportive counseling, where coping skills and dealing  with stressors of life will be addressed. Substance abuse counseling will be done. At the time of discharge, if patient stabilized, appropriate followup appointments will be made in the community and substance abuse help will be given if she is interested in help from any rehab program.    ____________________________ Jannet MantisSurya K. Guss Bundehalla, MD skc:lt D: 11/16/2013 17:57:18 ET T: 11/16/2013 22:27:47 ET JOB#: [id number]  cc: Monika SalkSurya K. Guss Bundehalla, MD, <Dictator> Beau FannySURYA K Ndia Sampath MD ELECTRONICALLY SIGNED 11/17/2013 20:07

## 2014-09-27 ENCOUNTER — Emergency Department
Admission: EM | Admit: 2014-09-27 | Discharge: 2014-09-27 | Disposition: A | Discharge status: Home / Self Care | Payer: Medicaid Other | Attending: Emergency Medicine | Admitting: Emergency Medicine

## 2014-09-27 ENCOUNTER — Encounter: Payer: Self-pay | Admitting: Emergency Medicine

## 2014-09-27 DIAGNOSIS — L42 Pityriasis rosea: Secondary | ICD-10-CM

## 2014-09-27 DIAGNOSIS — Z3202 Encounter for pregnancy test, result negative: Secondary | ICD-10-CM | POA: Insufficient documentation

## 2014-09-27 DIAGNOSIS — R509 Fever, unspecified: Secondary | ICD-10-CM | POA: Diagnosis present

## 2014-09-27 HISTORY — DX: Unspecified mood (affective) disorder: F39

## 2014-09-27 LAB — POCT PREGNANCY, URINE: Preg Test, Ur: NEGATIVE

## 2014-09-27 LAB — BASIC METABOLIC PANEL
Anion gap: 10 (ref 5–15)
BUN: 8 mg/dL (ref 6–20)
CHLORIDE: 98 mmol/L — AB (ref 101–111)
CO2: 27 mmol/L (ref 22–32)
CREATININE: 0.77 mg/dL (ref 0.44–1.00)
Calcium: 8.8 mg/dL — ABNORMAL LOW (ref 8.9–10.3)
GFR calc non Af Amer: >60 mL/min (ref >60)
GLUCOSE: 161 mg/dL — AB (ref 65–99)
Potassium: 3.3 mmol/L — ABNORMAL LOW (ref 3.5–5.1)
SODIUM: 135 mmol/L (ref 135–145)

## 2014-09-27 LAB — CBC WITH DIFFERENTIAL/PLATELET
BASOS ABS: 0 10*3/uL (ref 0–0.1)
BASOS PCT: 0 %
EOS ABS: 0 10*3/uL (ref 0–0.7)
EOS PCT: 0 %
HCT: 43.5 % (ref 35.0–47.0)
Hemoglobin: 14.2 g/dL (ref 12.0–16.0)
LYMPHS ABS: 2.5 10*3/uL (ref 1.0–3.6)
Lymphocytes Relative: 27 %
MCH: 28.1 pg (ref 26.0–34.0)
MCHC: 32.7 g/dL (ref 32.0–36.0)
MCV: 86.1 fL (ref 80.0–100.0)
MONO ABS: 0.6 10*3/uL (ref 0.2–0.9)
MONOS PCT: 6 %
Neutro Abs: 6 10*3/uL (ref 1.4–6.5)
Neutrophils Relative %: 67 %
Platelets: 214 10*3/uL (ref 150–440)
RBC: 5.05 MIL/uL (ref 3.80–5.20)
RDW: 13.5 % (ref 11.5–14.5)
WBC: 9.1 10*3/uL (ref 3.6–11.0)

## 2014-09-27 LAB — URINALYSIS COMPLETE WITH MICROSCOPIC (ARMC ONLY)
BILIRUBIN URINE: NEGATIVE
Glucose, UA: NEGATIVE mg/dL
Hgb urine dipstick: NEGATIVE
Ketones, ur: NEGATIVE mg/dL
Leukocytes, UA: NEGATIVE
NITRITE: NEGATIVE
PH: 5 (ref 5.0–8.0)
Protein, ur: NEGATIVE mg/dL
RBC / HPF: NONE SEEN RBC/hpf (ref 0–5)
SPECIFIC GRAVITY, URINE: 1.024 (ref 1.005–1.030)

## 2014-09-27 MED ORDER — DIPHENHYDRAMINE HCL 50 MG/ML IJ SOLN
25.0000 mg | Freq: Once | INTRAMUSCULAR | Status: AC
  Administered 2014-09-27: 25 mg via INTRAVENOUS

## 2014-09-27 MED ORDER — DEXAMETHASONE SODIUM PHOSPHATE 10 MG/ML IJ SOLN
INTRAMUSCULAR | Status: AC
  Filled 2014-09-27: qty 1

## 2014-09-27 MED ORDER — DIPHENHYDRAMINE HCL 50 MG/ML IJ SOLN
INTRAMUSCULAR | Status: AC
  Filled 2014-09-27: qty 1

## 2014-09-27 MED ORDER — HYDROXYZINE PAMOATE 25 MG PO CAPS: 25.0000 mg | ORAL_CAPSULE | Freq: Three times a day (TID) | ORAL | Status: DC | PRN

## 2014-09-27 MED ORDER — SODIUM CHLORIDE 0.9 % IV BOLUS (SEPSIS)
1000.0000 mL | Freq: Once | INTRAVENOUS | Status: AC
  Administered 2014-09-27: 1000 mL via INTRAVENOUS

## 2014-09-27 MED ORDER — DEXAMETHASONE SODIUM PHOSPHATE 10 MG/ML IJ SOLN
10.0000 mg | Freq: Once | INTRAMUSCULAR | Status: AC
  Administered 2014-09-27: 10 mg via INTRAVENOUS

## 2014-09-27 MED ORDER — ONDANSETRON HCL 4 MG PO TABS: 4.0000 mg | ORAL_TABLET | Freq: Three times a day (TID) | ORAL | Status: DC | PRN

## 2014-09-27 NOTE — ED Provider Notes (Signed)
CSN: [id number]     Arrival date & time 09/27/14  1511 History   None    Chief Complaint  Patient presents with  . Insect Bite     (Consider location/radiation/quality/duration/timing/severity/associated sxs/prior Treatment) HPI believe she has a spider bite to her right ribs today she started developing body aches fevers sweats congestion and feeling very anxious and shaky nothing seemingly making it better or worse and no other complaints at this time  Past Medical History  Diagnosis Date  . Mood disorder unk   History reviewed. No pertinent past surgical history. History reviewed. No pertinent family history. History  Substance Use Topics  . Smoking status: Never Smoker   . Smokeless tobacco: Never Used  . Alcohol Use: No   OB History    No data available     Review of Systems  Date of 6 systems as best review the patient's upper noted in history of present illness Cardiovascular no chest pain Pulmonary no cough or trouble breathing GI no nausea or vomiting Head ears eyes nose neck and throat no rhinorrhea sinus pressure Neuro no numbness or tingling Musculoskeletal body aches  Allergies  Review of patient's allergies indicates no known allergies.  Home Medications   Prior to Admission medications   Medication Sig Start Date End Date Taking? Authorizing Provider  gabapentin (NEURONTIN) 300 MG capsule Take 300 mg by mouth at bedtime.   Yes Historical Provider, MD  hydrOXYzine (VISTARIL) 25 MG capsule Take 1 capsule (25 mg total) by mouth 3 (three) times daily as needed for itching or nausea. 09/27/14   Deddrick Saindon William C Abyan Cadman, PA-C  ondansetron (ZOFRAN) 4 MG tablet Take 1 tablet (4 mg total) by mouth every 8 (eight) hours as needed for nausea or vomiting. 09/27/14 09/27/15  Ronnita Paz William C Miro Balderson, PA-C   BP 123/72 mmHg  Pulse 103  Temp(Src) 98.7 F (37.1 C) (Oral)  Resp 22  Ht 5\' 4"  (1.626 m)  Wt 150 lb (68.04 kg)  BMI 25.73 kg/m2  SpO2 99% Physical Exam   Caucasian female appearing stated age she is well-developed well-nourished she is very anxious Vitals were reviewed from the nursing note Head ears eyes nose neck and throat exam was negative Cardiovascular showed a regular rate and rhythm no murmurs rubs gallops  pulmonary lungs are auscultation bilaterally  abdomen soft nontender bowel sounds positive in all 4 quadrants  psychologically patient is very anxious Skin is positive for Herald patch under her right breast with rash appearing on her back  ED Course  Procedures (including critical care time) Labs Review Labs Reviewed  BASIC METABOLIC PANEL - Abnormal; Notable for the following:    Potassium 3.3 (*)    Chloride 98 (*)    Glucose, Bld 161 (*)    Calcium 8.8 (*)    All other components within normal limits  URINALYSIS COMPLETEWITH MICROSCOPIC ([facility])  - Abnormal; Notable for the following:    Color, Urine YELLOW (*)    APPearance HAZY (*)    Bacteria, UA RARE (*)    Squamous Epithelial / LPF 0-5 (*)    All other components within normal limits  CBC WITH DIFFERENTIAL/PLATELET  POC URINE PREG, ED  POCT PREGNANCY, URINE         MDM  Decision making on this patient she is a female otherwise healthy who is appearing to develop pityriasis who is having some viral prodrome was given steroids and Benadryl here along with fluids she's feeling better I will discharge her  with prescription for Atarax she's take Tylenol Motrin at home as needed follow-up with dermatology if the rash persists return for any acute concerns or worsening symptoms    Final diagnoses:  Pityriasis rosea        Frona Yost Rosalyn GessWilliam C Brittainy Bucker, PA-C 09/27/14 1650  Sharyn CreamerMark Quale, MD 09/27/14 1711

## 2014-09-27 NOTE — ED Notes (Signed)
Patient to ER for c/o spider bite to right ribs. Patient tearful and anxious.

## 2014-09-27 NOTE — Discharge Instructions (Signed)
Pityriasis Rosea Pityriasis rosea is a rash which is probably caused by a virus. It generally starts as a scaly, red patch on the trunk (the area of the body that a t-shirt would cover) but does not appear on sun exposed areas. The rash is usually preceded by an initial larger spot called the "herald patch" a week or more before the rest of the rash appears. Generally within one to two days the rash appears rapidly on the trunk, upper arms, and sometimes the upper legs. The rash usually appears as flat, oval patches of scaly pink color. The rash can also be raised and one is able to feel it with a finger. The rash can also be finely crinkled and may slough off leaving a ring of scale around the spot. Sometimes a mild sore throat is present with the rash. It usually affects children and young adults in the spring and autumn. Women are more frequently affected than men. TREATMENT  Pityriasis rosea is a self-limited condition. This means it goes away within 4 to 8 weeks without treatment. The spots may persist for several months, especially in darker-colored skin after the rash has resolved and healed. Benadryl and steroid creams may be used if itching is a problem. SEEK MEDICAL CARE IF:   Your rash does not go away or persists longer than three months.  You develop fever and joint pain.  You develop severe headache and confusion.  You develop breathing difficulty, vomiting and/or extreme weakness. Document Released: 06/08/2001 Document Revised: 07/25/2011 Document Reviewed: 06/27/2008 [facility] Patient Information 2015 [employer] [place]. This information is not intended to replace advice given to you by your health care provider. Make sure you discuss any questions you have with your health care provider.    Drink plenty of fluids take tylenol or motrin as needed for fever or discomfort

## 2014-12-08 ENCOUNTER — Encounter: Payer: Self-pay | Admitting: Emergency Medicine

## 2014-12-08 ENCOUNTER — Emergency Department
Admission: EM | Admit: 2014-12-08 | Discharge: 2014-12-08 | Discharge status: Against Medical Advice | Payer: Medicaid Other | Attending: Emergency Medicine | Admitting: Emergency Medicine

## 2014-12-08 DIAGNOSIS — K921 Melena: Secondary | ICD-10-CM | POA: Diagnosis not present

## 2014-12-08 DIAGNOSIS — R109 Unspecified abdominal pain: Secondary | ICD-10-CM | POA: Diagnosis present

## 2014-12-08 LAB — COMPREHENSIVE METABOLIC PANEL
ALK PHOS: 67 U/L (ref 38–126)
ALT: 21 U/L (ref 14–54)
ANION GAP: 8 (ref 5–15)
AST: 26 U/L (ref 15–41)
Albumin: 4.9 g/dL (ref 3.5–5.0)
BILIRUBIN TOTAL: 0.6 mg/dL (ref 0.3–1.2)
BUN: 10 mg/dL (ref 6–20)
CALCIUM: 9.4 mg/dL (ref 8.9–10.3)
CO2: 24 mmol/L (ref 22–32)
Chloride: 103 mmol/L (ref 101–111)
Creatinine, Ser: 0.84 mg/dL (ref 0.44–1.00)
GFR calc Af Amer: >60 mL/min (ref >60)
Glucose, Bld: 113 mg/dL — ABNORMAL HIGH (ref 65–99)
Potassium: 3.6 mmol/L (ref 3.5–5.1)
SODIUM: 135 mmol/L (ref 135–145)
Total Protein: 8.5 g/dL — ABNORMAL HIGH (ref 6.5–8.1)

## 2014-12-08 LAB — ABO/RH: ABO/RH(D): O POS

## 2014-12-08 LAB — CBC
HEMATOCRIT: 43.6 % (ref 35.0–47.0)
Hemoglobin: 14.7 g/dL (ref 12.0–16.0)
MCH: 28.7 pg (ref 26.0–34.0)
MCHC: 33.7 g/dL (ref 32.0–36.0)
MCV: 85.3 fL (ref 80.0–100.0)
Platelets: 242 10*3/uL (ref 150–440)
RBC: 5.11 MIL/uL (ref 3.80–5.20)
RDW: 12.9 % (ref 11.5–14.5)
WBC: 6.7 10*3/uL (ref 3.6–11.0)

## 2014-12-08 LAB — TYPE AND SCREEN
ABO/RH(D): O POS
Antibody Screen: NEGATIVE

## 2014-12-08 NOTE — ED Notes (Signed)
Pt to ed with c/o abd pain, nausea, vomiting, and diarrhea x 1 month,  Pt states she was seen by PMD and told she had ulcers was told if she had black stools, to come to ed,  Pt states she awoke this am and had black diarrhea stools.

## 2014-12-09 LAB — POCT PREGNANCY, URINE: PREG TEST UR: NEGATIVE

## 2014-12-17 ENCOUNTER — Other Ambulatory Visit: Payer: Self-pay | Admitting: Gastroenterology

## 2014-12-17 DIAGNOSIS — R1013 Epigastric pain: Secondary | ICD-10-CM

## 2014-12-22 ENCOUNTER — Ambulatory Visit
Admission: RE | Admit: 2014-12-22 | Discharge: 2014-12-22 | Disposition: A | Discharge status: Home / Self Care | Payer: Medicaid Other | Source: Ambulatory Visit | Attending: Gastroenterology | Admitting: Gastroenterology

## 2014-12-22 DIAGNOSIS — K449 Diaphragmatic hernia without obstruction or gangrene: Secondary | ICD-10-CM | POA: Diagnosis not present

## 2014-12-22 DIAGNOSIS — R1013 Epigastric pain: Secondary | ICD-10-CM

## 2014-12-22 DIAGNOSIS — R1310 Dysphagia, unspecified: Secondary | ICD-10-CM | POA: Insufficient documentation

## 2015-01-22 ENCOUNTER — Encounter: Payer: Self-pay | Admitting: *Deleted

## 2015-01-23 ENCOUNTER — Encounter
Admission: RE | Disposition: A | Discharge status: Home / Self Care | Payer: Self-pay | Source: Ambulatory Visit | Attending: Gastroenterology

## 2015-01-23 ENCOUNTER — Ambulatory Visit
Admission: RE | Admit: 2015-01-23 | Discharge: 2015-01-23 | Disposition: A | Discharge status: Home / Self Care | Payer: Medicaid Other | Source: Ambulatory Visit | Attending: Gastroenterology | Admitting: Gastroenterology

## 2015-01-23 ENCOUNTER — Ambulatory Visit: Payer: Medicaid Other | Admitting: Anesthesiology

## 2015-01-23 ENCOUNTER — Encounter: Payer: Self-pay | Admitting: Anesthesiology

## 2015-01-23 DIAGNOSIS — R1011 Right upper quadrant pain: Secondary | ICD-10-CM | POA: Insufficient documentation

## 2015-01-23 DIAGNOSIS — K210 Gastro-esophageal reflux disease with esophagitis: Secondary | ICD-10-CM | POA: Diagnosis not present

## 2015-01-23 DIAGNOSIS — R1013 Epigastric pain: Secondary | ICD-10-CM | POA: Diagnosis present

## 2015-01-23 DIAGNOSIS — Z79899 Other long term (current) drug therapy: Secondary | ICD-10-CM | POA: Diagnosis not present

## 2015-01-23 DIAGNOSIS — K2980 Duodenitis without bleeding: Secondary | ICD-10-CM | POA: Insufficient documentation

## 2015-01-23 DIAGNOSIS — K2970 Gastritis, unspecified, without bleeding: Secondary | ICD-10-CM | POA: Diagnosis not present

## 2015-01-23 DIAGNOSIS — Z8711 Personal history of peptic ulcer disease: Secondary | ICD-10-CM | POA: Diagnosis not present

## 2015-01-23 DIAGNOSIS — R1310 Dysphagia, unspecified: Secondary | ICD-10-CM | POA: Diagnosis not present

## 2015-01-23 DIAGNOSIS — F39 Unspecified mood [affective] disorder: Secondary | ICD-10-CM | POA: Diagnosis not present

## 2015-01-23 DIAGNOSIS — Z885 Allergy status to narcotic agent status: Secondary | ICD-10-CM | POA: Insufficient documentation

## 2015-01-23 HISTORY — DX: Gastric ulcer, unspecified as acute or chronic, without hemorrhage or perforation: K25.9

## 2015-01-23 HISTORY — PX: ESOPHAGOGASTRODUODENOSCOPY (EGD) WITH PROPOFOL: SHX5813

## 2015-01-23 LAB — POCT PREGNANCY, URINE: Preg Test, Ur: NEGATIVE

## 2015-01-23 SURGERY — ESOPHAGOGASTRODUODENOSCOPY (EGD) WITH PROPOFOL
Anesthesia: General

## 2015-01-23 MED ORDER — PROPOFOL 10 MG/ML IV BOLUS
INTRAVENOUS | Status: DC | PRN
  Administered 2015-01-23 (×2): 30 mg via INTRAVENOUS
  Administered 2015-01-23: 80 mg via INTRAVENOUS
  Administered 2015-01-23 (×2): 40 mg via INTRAVENOUS

## 2015-01-23 MED ORDER — SODIUM CHLORIDE 0.9 % IV SOLN
INTRAVENOUS | Status: DC
  Administered 2015-01-23: 1000 mL via INTRAVENOUS

## 2015-01-23 MED ORDER — LIDOCAINE HCL (CARDIAC) 20 MG/ML IV SOLN
INTRAVENOUS | Status: DC | PRN
  Administered 2015-01-23: 100 mg via INTRAVENOUS

## 2015-01-23 MED ORDER — LIDOCAINE HCL (PF) 1 % IJ SOLN
2.0000 mL | Freq: Once | INTRAMUSCULAR | Status: AC
  Administered 2015-01-23: 0.3 mL via INTRADERMAL

## 2015-01-23 MED ORDER — GLYCOPYRROLATE 0.2 MG/ML IJ SOLN
INTRAMUSCULAR | Status: DC | PRN
  Administered 2015-01-23: 0.2 mg via INTRAVENOUS

## 2015-01-23 MED ORDER — FENTANYL CITRATE (PF) 100 MCG/2ML IJ SOLN
INTRAMUSCULAR | Status: DC | PRN
  Administered 2015-01-23 (×2): 50 ug via INTRAVENOUS

## 2015-01-23 MED ORDER — MIDAZOLAM HCL 5 MG/5ML IJ SOLN
INTRAMUSCULAR | Status: DC | PRN
  Administered 2015-01-23: 2 mg via INTRAVENOUS

## 2015-01-23 MED ORDER — LIDOCAINE HCL (PF) 1 % IJ SOLN
INTRAMUSCULAR | Status: AC
  Administered 2015-01-23: 0.3 mL via INTRADERMAL
  Filled 2015-01-23: qty 2

## 2015-01-23 MED ORDER — PROPOFOL INFUSION 10 MG/ML OPTIME
INTRAVENOUS | Status: DC | PRN
  Administered 2015-01-23: 200 ug/kg/min via INTRAVENOUS

## 2015-01-23 NOTE — Transfer of Care (Signed)
Immediate Anesthesia Transfer of Care Note  Patient: Kelly Peterson  Procedure(s) Performed: Procedure(s): ESOPHAGOGASTRODUODENOSCOPY (EGD) WITH PROPOFOL (N/A)  Patient Location: Endoscopy Unit  Anesthesia Type:General  Level of Consciousness: awake  Airway & Oxygen Therapy: Patient Spontanous Breathing and Patient connected to nasal cannula oxygen  Post-op Assessment: Report given to RN and Post -op Vital signs reviewed and stable  Post vital signs: Reviewed and stable  Last Vitals:  Filed Vitals:   01/23/15 1343  BP: 115/72  Pulse: 80  Temp: 37.1 C  Resp: 17    Complications: No apparent anesthesia complications

## 2015-01-23 NOTE — Anesthesia Preprocedure Evaluation (Signed)
Anesthesia Evaluation  Patient identified by MRN, date of birth, ID band Patient awake    Reviewed: Allergy & Precautions, H&P , NPO status , Patient's Chart, lab work & pertinent test results  History of Anesthesia Complications Negative for: history of anesthetic complications  Airway Mallampati: II  TM Distance: >3 FB Neck ROM: full    Dental no notable dental hx. (+) Teeth Intact   Pulmonary neg pulmonary ROS,    Pulmonary exam normal breath sounds clear to auscultation       Cardiovascular Exercise Tolerance: Good (-) Past MI negative cardio ROS Normal cardiovascular exam Rhythm:regular Rate:Normal     Neuro/Psych PSYCHIATRIC DISORDERS negative neurological ROS     GI/Hepatic Neg liver ROS, PUD, GERD  Controlled,  Endo/Other  negative endocrine ROS  Renal/GU negative Renal ROS  negative genitourinary   Musculoskeletal   Abdominal   Peds  Hematology negative hematology ROS (+)   Anesthesia Other Findings Past Medical History:   Mood disorder                                   unk          Stomach ulcer                                                Reproductive/Obstetrics negative OB ROS                             Anesthesia Physical Anesthesia Plan  ASA: III  Anesthesia Plan: General   Post-op Pain Management:    Induction:   Airway Management Planned:   Additional Equipment:   Intra-op Plan:   Post-operative Plan:   Informed Consent: I have reviewed the patients History and Physical, chart, labs and discussed the procedure including the risks, benefits and alternatives for the proposed anesthesia with the patient or authorized representative who has indicated his/her understanding and acceptance.   Dental Advisory Given  Plan Discussed with: Anesthesiologist, CRNA and Surgeon  Anesthesia Plan Comments:         Anesthesia Quick Evaluation

## 2015-01-23 NOTE — H&P (Signed)
Outpatient short stay form Pre-procedure 01/23/2015 2:51 PM Christena Deem MD  Primary Physician: Dr. Sharee Pimple  Reason for visit:  EGD  History of present illness:  Asian is a 34 year old female presenting today with complaints of epigastric pain, right upper quadrant pain, odynophagia and dysphagia dyspepsia has been occurring over the period past 2 months. She has been placed on a proton pump inhibitor at least several weeks this has not been of a lot of benefit. He previously was taking ibuprofen up until the time that she saw Mrs. London on 12/17/2014. She has no dysphagia.    Current facility-administered medications:  .  0.9 %  sodium chloride infusion, , Intravenous, Continuous, Christena Deem, MD, Last Rate: 20 mL/hr at 01/23/15 1403, 1,000 mL at 01/23/15 1403  Prescriptions prior to admission  Medication Sig Dispense Refill Last Dose  . pantoprazole (PROTONIX) 40 MG tablet Take 40 mg by mouth daily.     Marland Kitchen gabapentin (NEURONTIN) 300 MG capsule Take 300 mg by mouth at bedtime.     . hydrOXYzine (VISTARIL) 25 MG capsule Take 1 capsule (25 mg total) by mouth 3 (three) times daily as needed for itching or nausea. 21 capsule 0   . ondansetron (ZOFRAN) 4 MG tablet Take 1 tablet (4 mg total) by mouth every 8 (eight) hours as needed for nausea or vomiting. 15 tablet 1      Allergies  Allergen Reactions  . Morphine And Related      Past Medical History  Diagnosis Date  . Mood disorder unk  . Stomach ulcer     Review of systems:      Physical Exam    Heart and lungs: Regular rate and rhythm without rub or gallop, lungs are bilaterally clear    HEENT: Norm cephalic atraumatic eyes are anicteric    Other:     Pertinant exam for procedure: Soft mild tenderness to palpation in the epigastric region. Bowel sounds are positive normoactive. There are no masses or organomegaly.    Planned proceedures: EGD with indicated procedures. I have discussed the risks  benefits and complications of procedures to include not limited to bleeding, infection, perforation and the risk of sedation and the patient wishes to proceed. Outpatient short stay form Pre-procedure Christena Deem, MD Gastroenterology 01/23/2015  2:51 PM

## 2015-01-23 NOTE — Op Note (Signed)
[facility] Gastroenterology Patient Name: Kelly Peterson Procedure Date: 01/23/2015 2:55 PM MRN: [id number] Account #: [id number] Date of Birth: 1980-09-16 Admit Type: Outpatient Age: 34 Room: [facility] ENDO ROOM 1 Gender: Female Note Status: Finalized Procedure:         Upper GI endoscopy Indications:       Epigastric abdominal pain, Abdominal pain in the right                     upper quadrant, Dyspepsia, Odynophagia Providers:         Christena Deem, MD Referring MD:      Dustin Folks, MD (Referring MD) Medicines:         Monitored Anesthesia Care Complications:     No immediate complications. Procedure:         Pre-Anesthesia Assessment:                    - ASA Grade Assessment: III - A patient with severe                     systemic disease.                    After obtaining informed consent, the endoscope was passed                     under direct vision. Throughout the procedure, the                     patient's blood pressure, pulse, and oxygen saturations                     were monitored continuously. The Endoscope was introduced                     through the mouth, and advanced to the fourth part of                     duodenum. The upper GI endoscopy was accomplished without                     difficulty. The patient tolerated the procedure well. Findings:      LA Grade A (one or more mucosal breaks less than 5 mm, not extending       between tops of 2 mucosal folds) esophagitis with no bleeding was found.       Biopsies were taken with a cold forceps for histology.      The exam of the esophagus was otherwise normal.      Localized minimal inflammation characterized by erythema was found in       the gastric body and on the greater curvature of the gastric body.       Biopsies were taken with a cold forceps for histology. Biopsies were       taken with a cold forceps for Helicobacter pylori testing.      The examined duodenum was  normal.      Patchy minimal inflammation characterized by erythema was found in the       first part of the duodenum and in the second part of the duodenum.       Biopsies were taken with a cold forceps for histology. Impression:        - LA Grade A reflux esophagitis. Biopsied.                    -  Gastritis. Biopsied.                    - Normal examined duodenum.                    - Duodenitis. Biopsied. Recommendation:    - Continue present medications.                    - Await pathology results.                    - Return to GI clinic in 1 month. Procedure Code(s): --- Professional ---                    [phone], Esophagogastroduodenoscopy, flexible, transoral;                     with biopsy, single or multiple Diagnosis Code(s): --- Professional ---                    530.11, Reflux esophagitis                    535.50, Unspecified gastritis and gastroduodenitis,                     without mention of hemorrhage                    535.60, Duodenitis, without mention of hemorrhage                    789.06, Abdominal pain, epigastric                    789.01, Abdominal pain, right upper quadrant                    536.8, Dyspepsia and other specified disorders of function                     of stomach                    787.20, Dysphagia, unspecified CPT copyright 2014 [employer]. All rights reserved. The codes documented in this report are preliminary and upon coder review may  be revised to meet current compliance requirements. Christena Deem, MD 01/23/2015 3:26:04 PM This report has been signed electronically. Number of Addenda: 0 Note Initiated On: 01/23/2015 2:55 PM      [facility]

## 2015-01-24 NOTE — Anesthesia Postprocedure Evaluation (Signed)
  Anesthesia Post-op Note  Patient: Kelly Peterson  Procedure(s) Performed: Procedure(s): ESOPHAGOGASTRODUODENOSCOPY (EGD) WITH PROPOFOL (N/A)  Anesthesia type:General  Patient location: PACU  Post pain: Pain level controlled  Post assessment: Post-op Vital signs reviewed, Patient's Cardiovascular Status Stable, Respiratory Function Stable, Patent Airway and No signs of Nausea or vomiting  Post vital signs: Reviewed and stable  Last Vitals:  Filed Vitals:   01/23/15 1600  BP: 119/78  Pulse: 94  Temp:   Resp: 22    Level of consciousness: awake, alert  and patient cooperative  Complications: No apparent anesthesia complications

## 2015-01-27 LAB — SURGICAL PATHOLOGY

## 2015-09-13 ENCOUNTER — Emergency Department: Payer: Medicaid Other

## 2015-09-13 ENCOUNTER — Emergency Department
Admission: EM | Admit: 2015-09-13 | Discharge: 2015-09-13 | Disposition: A | Discharge status: Home / Self Care | Payer: Medicaid Other | Attending: Emergency Medicine | Admitting: Emergency Medicine

## 2015-09-13 ENCOUNTER — Encounter: Payer: Self-pay | Admitting: Emergency Medicine

## 2015-09-13 DIAGNOSIS — R102 Pelvic and perineal pain: Secondary | ICD-10-CM

## 2015-09-13 LAB — URINALYSIS COMPLETE WITH MICROSCOPIC (ARMC ONLY)
Bacteria, UA: NONE SEEN
Bilirubin Urine: NEGATIVE
GLUCOSE, UA: NEGATIVE mg/dL
HGB URINE DIPSTICK: NEGATIVE
Leukocytes, UA: NEGATIVE
NITRITE: NEGATIVE
PROTEIN: NEGATIVE mg/dL
SPECIFIC GRAVITY, URINE: 1.009 (ref 1.005–1.030)
pH: 7 (ref 5.0–8.0)

## 2015-09-13 LAB — CBC
HCT: 38.9 % (ref 35.0–47.0)
Hemoglobin: 13.3 g/dL (ref 12.0–16.0)
MCH: 27.9 pg (ref 26.0–34.0)
MCHC: 34.1 g/dL (ref 32.0–36.0)
MCV: 81.9 fL (ref 80.0–100.0)
Platelets: 159 10*3/uL (ref 150–440)
RBC: 4.75 MIL/uL (ref 3.80–5.20)
RDW: 13.3 % (ref 11.5–14.5)
WBC: 7.1 10*3/uL (ref 3.6–11.0)

## 2015-09-13 LAB — POCT PREGNANCY, URINE: PREG TEST UR: NEGATIVE

## 2015-09-13 LAB — COMPREHENSIVE METABOLIC PANEL
ALT: 17 U/L (ref 14–54)
AST: 22 U/L (ref 15–41)
Albumin: 4.7 g/dL (ref 3.5–5.0)
Alkaline Phosphatase: 54 U/L (ref 38–126)
Anion gap: 8 (ref 5–15)
BUN: 10 mg/dL (ref 6–20)
CHLORIDE: 106 mmol/L (ref 101–111)
CO2: 26 mmol/L (ref 22–32)
Calcium: 9 mg/dL (ref 8.9–10.3)
Creatinine, Ser: 0.73 mg/dL (ref 0.44–1.00)
Glucose, Bld: 96 mg/dL (ref 65–99)
Potassium: 3.8 mmol/L (ref 3.5–5.1)
SODIUM: 140 mmol/L (ref 135–145)
Total Bilirubin: 0.6 mg/dL (ref 0.3–1.2)
Total Protein: 7.6 g/dL (ref 6.5–8.1)

## 2015-09-13 LAB — LIPASE, BLOOD: LIPASE: 19 U/L (ref 11–51)

## 2015-09-13 MED ORDER — ONDANSETRON HCL 4 MG/2ML IJ SOLN
4.0000 mg | Freq: Once | INTRAMUSCULAR | Status: AC
  Administered 2015-09-13: 4 mg via INTRAVENOUS
  Filled 2015-09-13: qty 2

## 2015-09-13 MED ORDER — HYDROMORPHONE HCL 1 MG/ML IJ SOLN
0.5000 mg | Freq: Once | INTRAMUSCULAR | Status: AC
  Administered 2015-09-13: 0.5 mg via INTRAVENOUS
  Filled 2015-09-13: qty 1

## 2015-09-13 MED ORDER — HYDROCODONE-ACETAMINOPHEN 5-325 MG PO TABS: 1.0000 | ORAL_TABLET | ORAL | Status: DC | PRN

## 2015-09-13 NOTE — ED Provider Notes (Signed)
[facility] Emergency Department Provider Note  ____________________________________________    I have reviewed the triage vital signs and the nursing notes.   HISTORY  Chief Complaint Pelvic Pain    HPI Kelly Peterson is a 35 y.o. female who presents with complaints of bilateral pelvic pain. Patient reports a history of pelvic pain and is actually scheduled for surgery for eval for endometriosis with OB/GYN. She reports gradual onset of bilateral pelvic pain today which has become steadily worse. The pain is now severe and she is tearful and anxious. She denies nausea or vomiting. No diarrhea. No fevers. No sick contacts. History of similar pain but this is worse.     Past Medical History  Diagnosis Date  . Mood disorder (HCC) unk  . Stomach ulcer     There are no active problems to display for this patient.   Past Surgical History  Procedure Laterality Date  . Dilation and curettage of uterus    . Esophagogastroduodenoscopy (egd) with propofol N/A 01/23/2015    Procedure: ESOPHAGOGASTRODUODENOSCOPY (EGD) WITH PROPOFOL;  Surgeon: Christena Deem, MD;  Location: [facility] ENDOSCOPY;  Service: Endoscopy;  Laterality: N/A;    Current Outpatient Rx  Name  Route  Sig  Dispense  Refill  . gabapentin (NEURONTIN) 300 MG capsule   Oral   Take 300 mg by mouth at bedtime.         . hydrOXYzine (VISTARIL) 25 MG capsule   Oral   Take 1 capsule (25 mg total) by mouth 3 (three) times daily as needed for itching or nausea.   21 capsule   0   . ondansetron (ZOFRAN) 4 MG tablet   Oral   Take 1 tablet (4 mg total) by mouth every 8 (eight) hours as needed for nausea or vomiting.   15 tablet   1   . pantoprazole (PROTONIX) 40 MG tablet   Oral   Take 40 mg by mouth daily.           Allergies Morphine and related  History reviewed. No pertinent family history.  Social History Social History  Substance Use Topics  . Smoking status: Never Smoker    . Smokeless tobacco: Never Used  . Alcohol Use: No    Review of Systems  Constitutional: Negative for fever.  ENT: Negative for sore throat Cardiovascular: Negative for chest pain Respiratory: Negative for cough Gastrointestinal: No nausea or vomiting Genitourinary: No frequency or vaginal discharge or bleeding Musculoskeletal: Negative for back pain. Skin: Negative for rash. Neurological: Negative for focal weakness Psychiatric: Positive for anxiety    ____________________________________________   PHYSICAL EXAM:  VITAL SIGNS: ED Triage Vitals  Enc Vitals Group     BP 09/13/15 1340 126/73 mmHg     Pulse Rate 09/13/15 1340 98     Resp 09/13/15 1340 20     Temp 09/13/15 1340 97.9 F (36.6 C)     Temp Source 09/13/15 1340 Oral     SpO2 09/13/15 1340 99 %     Weight 09/13/15 1340 125 lb (56.7 kg)     Height 09/13/15 1340  (1.6 m)     Head Cir --      Peak Flow --      Pain Score 09/13/15 1407 10     Pain Loc --      Pain Edu? --      Excl. in GC? --      Constitutional: Alert and oriented. Tearful and uncomfortable Eyes: Conjunctivae  are normal. No erythema or injection ENT   Head: Normocephalic and atraumatic.   Mouth/Throat: Mucous membranes are moist. Cardiovascular: Normal rate, regular rhythm. Normal and symmetric distal pulses are present in the upper extremities.  Respiratory: Normal respiratory effort without tachypnea nor retractions. Breath sounds are clear and equal bilaterally.  Gastrointestinal: Soft and non-tender in all quadrants. No distention. There is no CVA tenderness. Genitourinary: Deferred per patient request Musculoskeletal: Nontender with normal range of motion in all extremities. No lower extremity tenderness nor edema. Neurologic:  Normal speech and language. No gross focal neurologic deficits are appreciated. Skin:  Skin is warm, dry and intact. No rash noted. Psychiatric: Patient exhibits appropriate insight and  judgment.  ____________________________________________    LABS (pertinent positives/negatives)  Labs Reviewed  CBC  COMPREHENSIVE METABOLIC PANEL  LIPASE, BLOOD  URINALYSIS COMPLETEWITH MICROSCOPIC ([facility] ONLY)    ____________________________________________   EKG  None  ____________________________________________    RADIOLOGY  Ultrasound pelvis unremarkable  ____________________________________________   PROCEDURES  Procedure(s) performed: none  Critical Care performed: none  ____________________________________________   INITIAL IMPRESSION / ASSESSMENT AND PLAN / ED COURSE  Pertinent labs & imaging results that were available during my care of the patient were reviewed by me and considered in my medical decision making (see chart for details).  Patient results with bilateral pelvic cramping sensation. She is obviously uncomfortable. We will give Dilaudid 0.5 mg IV and Zofran IV, check labs, urine and reevaluate.  ----------------------------------------- 4:06 PM on 09/13/2015 -----------------------------------------  Patient has had significant improvement in pain ----------------------------------------- 5:34 PM on 09/13/2015 -----------------------------------------  Patient continues to feel improved. Ultrasound is unremarkable. At this point we will discharge the patient with pain medications and GYN follow-up. She understands she can return at any time if return of pain. She is comfortable with this plan. ____________________________________________   FINAL CLINICAL IMPRESSION(S) / ED DIAGNOSES  Final diagnoses:  Acute pelvic pain, female  Acute pelvic pain, female          Jene Everyobert Lowanda Cashaw, MD 09/13/15 1734

## 2015-09-13 NOTE — ED Notes (Signed)
Patient transported to Ultrasound 

## 2015-09-13 NOTE — ED Notes (Signed)
Pt to ED from home c/o pelvic pain that started today around noon.  Pt states returned home from taking a walk and started cramping that worsened into knife like pain.  Pt states extreme pain with urination.  Pt actively crying, A&Ox4, speaking in complete and coherent sentences.

## 2015-09-15 MED ORDER — FENTANYL CITRATE (PF) 100 MCG/2ML IJ SOLN
INTRAMUSCULAR | Status: AC
  Filled 2015-09-15: qty 2

## 2015-09-18 ENCOUNTER — Encounter: Payer: Self-pay | Admitting: *Deleted

## 2015-09-18 ENCOUNTER — Other Ambulatory Visit: Payer: Medicaid Other

## 2015-09-18 NOTE — Patient Instructions (Signed)
  Your procedure is scheduled on: 09-24-15 Report to [facility] SAME [facility]GERY 2ND FLOOR To find out your arrival time please call [phone] between 1PM - 3PM on 09-23-15  Remember: Instructions that are not followed completely may result in serious medical risk, up to and including death, or upon the discretion of your surgeon and anesthesiologist your surgery may need to be rescheduled.    _X___ 1. Do not eat food or drink liquids after midnight. No gum chewing or hard candies.     _X___ 2. No Alcohol for 24 hours before or after surgery.   ____ 3. Bring all medications with you on the day of surgery if instructed.    ____ 4. Notify your doctor if there is any change in your medical condition     (cold, fever, infections).     Do not wear jewelry, make-up, hairpins, clips or nail polish.  Do not wear lotions, powders, or perfumes. You may wear deodorant.  Do not shave 48 hours prior to surgery. Men may shave face and neck.  Do not bring valuables to the hospital.    [facility] not responsible for any belongings or valuables.               Contacts, dentures or bridgework may not be worn into surgery.  Leave your suitcase in the car. After surgery it may be brought to your room.  For patients admitted to the hospital, discharge time is determined by your  treatment team.   Patients discharged the day of surgery will not be allowed to drive home.   Please read over the following fact sheets that you were given:      ____ Take these medicines the morning of surgery with A SIP OF WATER:    1. NONE  2.   3.   4.  5.  6.  ____ Fleet Enema (as directed)   ____ Use CHG Soap as directed  ____ Use inhalers on the day of surgery  ____ Stop metformin 2 days prior to surgery    ____ Take 1/2 of usual insulin dose the night before surgery and none on the morning of surgery.   ____ Stop Coumadin/Plavix/aspirin-N/A  _X___ Stop Anti-inflammatories-NO NSAIDS OR ASA  PRODUCTS-TYLENOL OK TO TAKE   ____ Stop supplements until after surgery.    ____ Bring C-Pap to the hospital.

## 2015-10-06 ENCOUNTER — Ambulatory Visit
Admission: RE | Admit: 2015-10-06 | Payer: Medicaid Other | Source: Ambulatory Visit | Admitting: Obstetrics and Gynecology

## 2015-10-06 HISTORY — DX: Gastro-esophageal reflux disease without esophagitis: K21.9

## 2015-10-06 SURGERY — LAPAROSCOPY, DIAGNOSTIC
Anesthesia: Choice

## 2015-10-06 MED ORDER — FAMOTIDINE 20 MG PO TABS: 20.0000 mg | ORAL_TABLET | Freq: Once | ORAL | Status: DC

## 2015-10-06 MED ORDER — LACTATED RINGERS IV SOLN: INTRAVENOUS | Status: DC

## 2015-12-24 ENCOUNTER — Other Ambulatory Visit: Payer: Self-pay | Admitting: Nurse Practitioner

## 2015-12-24 ENCOUNTER — Ambulatory Visit: Admission: RE | Admit: 2015-12-24 | Payer: Medicaid Other | Source: Ambulatory Visit

## 2015-12-24 DIAGNOSIS — Z9181 History of falling: Secondary | ICD-10-CM

## 2015-12-24 DIAGNOSIS — T148XXA Other injury of unspecified body region, initial encounter: Secondary | ICD-10-CM

## 2016-01-14 ENCOUNTER — Encounter: Payer: Self-pay | Admitting: Emergency Medicine

## 2016-01-14 ENCOUNTER — Emergency Department: Payer: Medicaid Other

## 2016-01-14 ENCOUNTER — Emergency Department
Admission: EM | Admit: 2016-01-14 | Discharge: 2016-01-14 | Disposition: A | Discharge status: Home / Self Care | Payer: Medicaid Other | Attending: Emergency Medicine | Admitting: Emergency Medicine

## 2016-01-14 DIAGNOSIS — R110 Nausea: Secondary | ICD-10-CM | POA: Insufficient documentation

## 2016-01-14 DIAGNOSIS — Z79899 Other long term (current) drug therapy: Secondary | ICD-10-CM | POA: Insufficient documentation

## 2016-01-14 DIAGNOSIS — R197 Diarrhea, unspecified: Secondary | ICD-10-CM | POA: Diagnosis not present

## 2016-01-14 DIAGNOSIS — R1030 Lower abdominal pain, unspecified: Secondary | ICD-10-CM

## 2016-01-14 DIAGNOSIS — N189 Chronic kidney disease, unspecified: Secondary | ICD-10-CM | POA: Insufficient documentation

## 2016-01-14 DIAGNOSIS — R1031 Right lower quadrant pain: Secondary | ICD-10-CM | POA: Insufficient documentation

## 2016-01-14 DIAGNOSIS — R1011 Right upper quadrant pain: Secondary | ICD-10-CM

## 2016-01-14 DIAGNOSIS — R102 Pelvic and perineal pain: Secondary | ICD-10-CM

## 2016-01-14 LAB — CBC WITH DIFFERENTIAL/PLATELET
Basophils Absolute: 0 10*3/uL (ref 0–0.1)
Basophils Relative: 1 %
Eosinophils Absolute: 0.1 10*3/uL (ref 0–0.7)
Eosinophils Relative: 1 %
HEMATOCRIT: 40.5 % (ref 35.0–47.0)
HEMOGLOBIN: 14.2 g/dL (ref 12.0–16.0)
LYMPHS PCT: 50 %
Lymphs Abs: 3.9 10*3/uL — ABNORMAL HIGH (ref 1.0–3.6)
MCH: 29.9 pg (ref 26.0–34.0)
MCHC: 35.1 g/dL (ref 32.0–36.0)
MCV: 85.3 fL (ref 80.0–100.0)
MONO ABS: 0.7 10*3/uL (ref 0.2–0.9)
MONOS PCT: 9 %
NEUTROS ABS: 3.1 10*3/uL (ref 1.4–6.5)
Neutrophils Relative %: 39 %
Platelets: 201 10*3/uL (ref 150–440)
RBC: 4.75 MIL/uL (ref 3.80–5.20)
RDW: 13 % (ref 11.5–14.5)
WBC: 7.8 10*3/uL (ref 3.6–11.0)

## 2016-01-14 LAB — URINALYSIS COMPLETE WITH MICROSCOPIC (ARMC ONLY)
BACTERIA UA: NONE SEEN
BILIRUBIN URINE: NEGATIVE
GLUCOSE, UA: NEGATIVE mg/dL
Hgb urine dipstick: NEGATIVE
KETONES UR: NEGATIVE mg/dL
LEUKOCYTES UA: NEGATIVE
Nitrite: NEGATIVE
PH: 7 (ref 5.0–8.0)
Protein, ur: NEGATIVE mg/dL
Specific Gravity, Urine: 1.005 (ref 1.005–1.030)
WBC, UA: NONE SEEN WBC/hpf (ref 0–5)

## 2016-01-14 LAB — COMPREHENSIVE METABOLIC PANEL
ALK PHOS: 47 U/L (ref 38–126)
ALT: 23 U/L (ref 14–54)
AST: 27 U/L (ref 15–41)
Albumin: 4.7 g/dL (ref 3.5–5.0)
Anion gap: 6 (ref 5–15)
BILIRUBIN TOTAL: 0.6 mg/dL (ref 0.3–1.2)
BUN: 13 mg/dL (ref 6–20)
CALCIUM: 9 mg/dL (ref 8.9–10.3)
CO2: 25 mmol/L (ref 22–32)
Chloride: 105 mmol/L (ref 101–111)
Creatinine, Ser: 0.78 mg/dL (ref 0.44–1.00)
GFR calc Af Amer: >60 mL/min (ref >60)
Glucose, Bld: 103 mg/dL — ABNORMAL HIGH (ref 65–99)
POTASSIUM: 4.1 mmol/L (ref 3.5–5.1)
Sodium: 136 mmol/L (ref 135–145)
TOTAL PROTEIN: 7.8 g/dL (ref 6.5–8.1)

## 2016-01-14 LAB — POCT PREGNANCY, URINE: Preg Test, Ur: NEGATIVE

## 2016-01-14 LAB — LIPASE, BLOOD: LIPASE: 28 U/L (ref 11–51)

## 2016-01-14 MED ORDER — KETOROLAC TROMETHAMINE 30 MG/ML IJ SOLN
30.0000 mg | Freq: Once | INTRAMUSCULAR | Status: AC
  Administered 2016-01-14: 30 mg via INTRAVENOUS
  Filled 2016-01-14: qty 1

## 2016-01-14 MED ORDER — OXYCODONE-ACETAMINOPHEN 5-325 MG PO TABS: 2.0000 | ORAL_TABLET | Freq: Four times a day (QID) | ORAL | 0 refills | Status: DC | PRN

## 2016-01-14 MED ORDER — HYDROMORPHONE HCL 1 MG/ML IJ SOLN
1.0000 mg | Freq: Once | INTRAMUSCULAR | Status: AC
  Administered 2016-01-14: 1 mg via INTRAVENOUS
  Filled 2016-01-14: qty 1

## 2016-01-14 MED ORDER — DIAZEPAM 5 MG/ML IJ SOLN
5.0000 mg | Freq: Once | INTRAMUSCULAR | Status: AC
  Administered 2016-01-14: 5 mg via INTRAVENOUS
  Filled 2016-01-14: qty 2

## 2016-01-14 MED ORDER — DIPHENHYDRAMINE HCL 50 MG/ML IJ SOLN
25.0000 mg | Freq: Once | INTRAMUSCULAR | Status: AC
  Administered 2016-01-14: 25 mg via INTRAVENOUS
  Filled 2016-01-14: qty 1

## 2016-01-14 MED ORDER — DIAZEPAM 5 MG PO TABS: 5.0000 mg | ORAL_TABLET | Freq: Three times a day (TID) | ORAL | 0 refills | Status: DC | PRN

## 2016-01-14 MED ORDER — HYDROMORPHONE HCL 1 MG/ML IJ SOLN
1.0000 mg | Freq: Once | INTRAMUSCULAR | Status: AC
  Administered 2016-01-14: 1 mg via INTRAVENOUS

## 2016-01-14 NOTE — ED Triage Notes (Signed)
PT with RLQ and RUQ pain that started last night mildly.  Pt denies vomiting, but reports diarrhea and nausea.  Pt to ED today via EMS after having unbearable pain on the way to work this morning.  Pt crying and writhing in pain on arrival.

## 2016-01-14 NOTE — ED Provider Notes (Signed)
[facility] Emergency Department Provider Note        Time seen: ----------------------------------------- 9:30 AM on 01/14/2016 -----------------------------------------    I have reviewed the triage vital signs and the nursing notes.   HISTORY  Chief Complaint No chief complaint on file.    HPI Kelly NoonJennifer L Machorro is a 35 y.o. female who presents to ER with right lower quadrant abdominal pain that started last night. Patient denies vomiting but does have diarrhea and nausea. Satting severe right lower quadrant pain that started on the way to work. She arrives crying and writhing in pain. She states this is different from herpain in the past which seem to be related to either endometriosis or a stomach ulcer. The makes the pain better.   Past Medical History:  Diagnosis Date  . Chronic kidney disease    STONES  . GERD (gastroesophageal reflux disease)    NO MEDS  . Mood disorder (HCC) unk  . Stomach ulcer     There are no active problems to display for this patient.   Past Surgical History:  Procedure Laterality Date  . DILATION AND CURETTAGE OF UTERUS    . ESOPHAGOGASTRODUODENOSCOPY (EGD) WITH PROPOFOL N/A 01/23/2015   Procedure: ESOPHAGOGASTRODUODENOSCOPY (EGD) WITH PROPOFOL;  Surgeon: Christena DeemMartin U Skulskie, MD;  [facility];  Service: Endoscopy;  Laterality: N/A;    Allergies Morphine and related  Social History Social History  Substance Use Topics  . Smoking status: Never Smoker  . Smokeless tobacco: Never Used  . Alcohol use No    Review of Systems Constitutional: Negative for fever. Cardiovascular: Negative for chest pain. Respiratory: Negative for shortness of breath. Gastrointestinal: Positive for abdominal pain, nausea and diarrhea Genitourinary: Negative for dysuria. Musculoskeletal: Negative for back pain. Skin: Negative for rash. Neurological: Negative for headaches, focal weakness or numbness.  10-point ROS  otherwise negative.  ____________________________________________   PHYSICAL EXAM:  VITAL SIGNS: ED Triage Vitals  Enc Vitals Group     BP      Pulse      Resp      Temp      Temp src      SpO2      Weight      Height      Head Circumference      Peak Flow      Pain Score      Pain Loc      Pain Edu?      Excl. in GC?     Constitutional: Alert and oriented. Anxious, mild to moderate distress Eyes: Conjunctivae are normal. PERRL. Normal extraocular movements. ENT   Head: Normocephalic and atraumatic.   Nose: No congestion/rhinnorhea.   Mouth/Throat: Mucous membranes are moist.   Neck: No stridor. Cardiovascular: Normal rate, regular rhythm. No murmurs, rubs, or gallops. Respiratory: Normal respiratory effort without tachypnea nor retractions. Breath sounds are clear and equal bilaterally. No wheezes/rales/rhonchi. Gastrointestinal: Right lower quadrant tenderness, no rebound or guarding. Normal bowel sounds. Musculoskeletal: Nontender with normal range of motion in all extremities. No lower extremity tenderness nor edema. Neurologic:  Normal speech and language. No gross focal neurologic deficits are appreciated.  Skin:  Skin is warm, dry and intact. No rash noted. Psychiatric: Mood and affect are normal. Speech and behavior are normal.  ____________________________________________  ED COURSE:  Pertinent labs & imaging results that were available during my care of the patient were reviewed by me and considered in my medical decision making (see chart for details). Clinical Course  Patient presents in significant pain of uncertain etiology. She will receive IV pain medicine and we will assess with ultrasound.  Procedures ____________________________________________   LABS (pertinent positives/negatives)  Labs Reviewed  CBC WITH DIFFERENTIAL/PLATELET - Abnormal; Notable for the following:       Result Value   Lymphs Abs 3.9 (*)    All other components  within normal limits  COMPREHENSIVE METABOLIC PANEL - Abnormal; Notable for the following:    Glucose, Bld 103 (*)    All other components within normal limits  URINALYSIS COMPLETEWITH MICROSCOPIC ([facility] ONLY) - Abnormal; Notable for the following:    Color, Urine STRAW (*)    APPearance CLEAR (*)    Squamous Epithelial / LPF 0-5 (*)    All other components within normal limits  LIPASE, BLOOD  POC URINE PREG, ED  POCT PREGNANCY, URINE    RADIOLOGY  Pelvic ultrasound, Right upper quadrant ultrasound IMPRESSION: Normal transabdominal and endovaginal pelvic ultrasound. Patient declines CT of the abdomen and pelvis  ____________________________________________  FINAL ASSESSMENT AND PLAN  Abdominal pain  Plan: Patient with labs and imaging as dictated above. No clear etiology for her symptoms was identified. She declined pelvic examination and CT imaging of the abdomen. Patient states she will follow up with her GYN doctor for evaluation of endometriosis. Advised her this is likely the etiology although I cannot know this for sure. She is stable for outpatient follow-up.   Emily Filbert, MD   Note: This dictation was prepared with Dragon dictation. Any transcriptional errors that result from this process are unintentional    Emily Filbert, MD 01/14/16 1352

## 2016-02-08 ENCOUNTER — Emergency Department: Payer: Medicaid Other

## 2016-02-08 ENCOUNTER — Observation Stay
Admission: EM | Admit: 2016-02-08 | Discharge: 2016-02-09 | Disposition: A | Discharge status: Home / Self Care | Payer: Medicaid Other | Attending: Internal Medicine | Admitting: Internal Medicine

## 2016-02-08 DIAGNOSIS — R52 Pain, unspecified: Secondary | ICD-10-CM | POA: Diagnosis present

## 2016-02-08 DIAGNOSIS — R1011 Right upper quadrant pain: Secondary | ICD-10-CM | POA: Insufficient documentation

## 2016-02-08 DIAGNOSIS — M62838 Other muscle spasm: Secondary | ICD-10-CM | POA: Insufficient documentation

## 2016-02-08 DIAGNOSIS — K5900 Constipation, unspecified: Secondary | ICD-10-CM | POA: Diagnosis not present

## 2016-02-08 DIAGNOSIS — F39 Unspecified mood [affective] disorder: Secondary | ICD-10-CM | POA: Diagnosis present

## 2016-02-08 DIAGNOSIS — R102 Pelvic and perineal pain: Secondary | ICD-10-CM | POA: Insufficient documentation

## 2016-02-08 DIAGNOSIS — Z79891 Long term (current) use of opiate analgesic: Secondary | ICD-10-CM | POA: Insufficient documentation

## 2016-02-08 DIAGNOSIS — Z885 Allergy status to narcotic agent status: Secondary | ICD-10-CM | POA: Insufficient documentation

## 2016-02-08 DIAGNOSIS — R1031 Right lower quadrant pain: Secondary | ICD-10-CM | POA: Diagnosis present

## 2016-02-08 DIAGNOSIS — Z79899 Other long term (current) drug therapy: Secondary | ICD-10-CM | POA: Diagnosis not present

## 2016-02-08 DIAGNOSIS — Z818 Family history of other mental and behavioral disorders: Secondary | ICD-10-CM | POA: Insufficient documentation

## 2016-02-08 DIAGNOSIS — Z803 Family history of malignant neoplasm of breast: Secondary | ICD-10-CM | POA: Diagnosis not present

## 2016-02-08 DIAGNOSIS — N281 Cyst of kidney, acquired: Secondary | ICD-10-CM | POA: Insufficient documentation

## 2016-02-08 DIAGNOSIS — Z8711 Personal history of peptic ulcer disease: Secondary | ICD-10-CM | POA: Diagnosis not present

## 2016-02-08 DIAGNOSIS — G8929 Other chronic pain: Secondary | ICD-10-CM | POA: Diagnosis not present

## 2016-02-08 DIAGNOSIS — Z9889 Other specified postprocedural states: Secondary | ICD-10-CM | POA: Diagnosis not present

## 2016-02-08 LAB — URINALYSIS COMPLETE WITH MICROSCOPIC (ARMC ONLY)
BACTERIA UA: NONE SEEN
BILIRUBIN URINE: NEGATIVE
GLUCOSE, UA: NEGATIVE mg/dL
Hgb urine dipstick: NEGATIVE
KETONES UR: NEGATIVE mg/dL
Leukocytes, UA: NEGATIVE
NITRITE: NEGATIVE
Protein, ur: NEGATIVE mg/dL
RBC / HPF: NONE SEEN RBC/hpf (ref 0–5)
SPECIFIC GRAVITY, URINE: 1.003 — AB (ref 1.005–1.030)
pH: 6 (ref 5.0–8.0)

## 2016-02-08 LAB — CBC WITH DIFFERENTIAL/PLATELET
Basophils Absolute: 0 10*3/uL (ref 0–0.1)
Basophils Relative: 0 %
Eosinophils Absolute: 0 10*3/uL (ref 0–0.7)
Eosinophils Relative: 1 %
HEMATOCRIT: 39.5 % (ref 35.0–47.0)
HEMOGLOBIN: 13.9 g/dL (ref 12.0–16.0)
LYMPHS ABS: 3.9 10*3/uL — AB (ref 1.0–3.6)
Lymphocytes Relative: 42 %
MCH: 29.8 pg (ref 26.0–34.0)
MCHC: 35.2 g/dL (ref 32.0–36.0)
MCV: 84.5 fL (ref 80.0–100.0)
MONO ABS: 0.6 10*3/uL (ref 0.2–0.9)
MONOS PCT: 7 %
NEUTROS ABS: 4.6 10*3/uL (ref 1.4–6.5)
NEUTROS PCT: 50 %
Platelets: 194 10*3/uL (ref 150–440)
RBC: 4.67 MIL/uL (ref 3.80–5.20)
RDW: 13 % (ref 11.5–14.5)
WBC: 9.2 10*3/uL (ref 3.6–11.0)

## 2016-02-08 LAB — COMPREHENSIVE METABOLIC PANEL
ALK PHOS: 43 U/L (ref 38–126)
ALT: 20 U/L (ref 14–54)
ANION GAP: 8 (ref 5–15)
AST: 29 U/L (ref 15–41)
Albumin: 4.3 g/dL (ref 3.5–5.0)
BILIRUBIN TOTAL: 0.6 mg/dL (ref 0.3–1.2)
BUN: 9 mg/dL (ref 6–20)
CALCIUM: 9.1 mg/dL (ref 8.9–10.3)
CO2: 23 mmol/L (ref 22–32)
Chloride: 106 mmol/L (ref 101–111)
Creatinine, Ser: 0.68 mg/dL (ref 0.44–1.00)
Glucose, Bld: 85 mg/dL (ref 65–99)
Potassium: 3.9 mmol/L (ref 3.5–5.1)
Sodium: 137 mmol/L (ref 135–145)
TOTAL PROTEIN: 7.2 g/dL (ref 6.5–8.1)

## 2016-02-08 LAB — HCG, QUANTITATIVE, PREGNANCY: hCG, Beta Chain, Quant, S: 1 m[IU]/mL (ref <5)

## 2016-02-08 LAB — POCT PREGNANCY, URINE: Preg Test, Ur: NEGATIVE

## 2016-02-08 LAB — LIPASE, BLOOD: LIPASE: 22 U/L (ref 11–51)

## 2016-02-08 MED ORDER — ACETAMINOPHEN 325 MG PO TABS: 650.0000 mg | ORAL_TABLET | Freq: Four times a day (QID) | ORAL | Status: DC | PRN

## 2016-02-08 MED ORDER — ONDANSETRON HCL 4 MG PO TABS: 4.0000 mg | ORAL_TABLET | Freq: Four times a day (QID) | ORAL | Status: DC | PRN

## 2016-02-08 MED ORDER — ENOXAPARIN SODIUM 40 MG/0.4ML ~~LOC~~ SOLN: 40.0000 mg | Freq: Every day | SUBCUTANEOUS | Status: DC

## 2016-02-08 MED ORDER — SODIUM CHLORIDE 0.9 % IV BOLUS (SEPSIS)
1000.0000 mL | Freq: Once | INTRAVENOUS | Status: AC
  Administered 2016-02-08: 1000 mL via INTRAVENOUS

## 2016-02-08 MED ORDER — IOPAMIDOL (ISOVUE-300) INJECTION 61%
100.0000 mL | Freq: Once | INTRAVENOUS | Status: AC | PRN
  Administered 2016-02-08: 100 mL via INTRAVENOUS

## 2016-02-08 MED ORDER — INFLUENZA VAC SPLIT QUAD 0.5 ML IM SUSY: 0.5000 mL | PREFILLED_SYRINGE | INTRAMUSCULAR | Status: DC

## 2016-02-08 MED ORDER — ONDANSETRON HCL 4 MG/2ML IJ SOLN
4.0000 mg | Freq: Once | INTRAMUSCULAR | Status: AC
  Administered 2016-02-08: 4 mg via INTRAVENOUS

## 2016-02-08 MED ORDER — HYDROMORPHONE HCL 1 MG/ML IJ SOLN
0.5000 mg | Freq: Four times a day (QID) | INTRAMUSCULAR | Status: DC | PRN
  Administered 2016-02-08 – 2016-02-09 (×2): 0.5 mg via INTRAVENOUS
  Filled 2016-02-08 (×2): qty 1

## 2016-02-08 MED ORDER — KETOROLAC TROMETHAMINE 30 MG/ML IJ SOLN
INTRAMUSCULAR | Status: AC
  Filled 2016-02-08: qty 1

## 2016-02-08 MED ORDER — ACETAMINOPHEN 650 MG RE SUPP: 650.0000 mg | Freq: Four times a day (QID) | RECTAL | Status: DC | PRN

## 2016-02-08 MED ORDER — HYDROMORPHONE HCL 1 MG/ML IJ SOLN
1.0000 mg | Freq: Once | INTRAMUSCULAR | Status: AC
  Administered 2016-02-08: 1 mg via INTRAVENOUS

## 2016-02-08 MED ORDER — IOPAMIDOL (ISOVUE-300) INJECTION 61%
30.0000 mL | Freq: Once | INTRAVENOUS | Status: AC | PRN
  Administered 2016-02-08: 30 mL via ORAL

## 2016-02-08 MED ORDER — ONDANSETRON HCL 4 MG/2ML IJ SOLN: 4.0000 mg | Freq: Four times a day (QID) | INTRAMUSCULAR | Status: DC | PRN

## 2016-02-08 MED ORDER — KETOROLAC TROMETHAMINE 60 MG/2ML IM SOLN
INTRAMUSCULAR | Status: AC
  Administered 2016-02-08: 60 mg via INTRAMUSCULAR
  Filled 2016-02-08: qty 2

## 2016-02-08 MED ORDER — HYDROMORPHONE HCL 1 MG/ML IJ SOLN
1.0000 mg | Freq: Once | INTRAMUSCULAR | Status: AC
  Administered 2016-02-08: 1 mg via INTRAVENOUS
  Filled 2016-02-08: qty 1

## 2016-02-08 MED ORDER — TRAZODONE HCL 50 MG PO TABS
50.0000 mg | ORAL_TABLET | Freq: Every day | ORAL | Status: DC
  Administered 2016-02-08: 50 mg via ORAL
  Filled 2016-02-08 (×2): qty 1

## 2016-02-08 MED ORDER — ONDANSETRON HCL 4 MG/2ML IJ SOLN
INTRAMUSCULAR | Status: AC
  Filled 2016-02-08: qty 2

## 2016-02-08 MED ORDER — CITALOPRAM HYDROBROMIDE 20 MG PO TABS
20.0000 mg | ORAL_TABLET | Freq: Every day | ORAL | Status: DC
  Filled 2016-02-08: qty 1

## 2016-02-08 MED ORDER — KETOROLAC TROMETHAMINE 60 MG/2ML IM SOLN
60.0000 mg | Freq: Once | INTRAMUSCULAR | Status: AC
  Administered 2016-02-08: 60 mg via INTRAMUSCULAR

## 2016-02-08 MED ORDER — GABAPENTIN 300 MG PO CAPS
300.0000 mg | ORAL_CAPSULE | Freq: Every day | ORAL | Status: DC
  Administered 2016-02-08: 300 mg via ORAL
  Filled 2016-02-08 (×2): qty 1

## 2016-02-08 MED ORDER — SODIUM CHLORIDE 0.9 % IV SOLN
INTRAVENOUS | Status: DC
  Administered 2016-02-08: 22:00:00 via INTRAVENOUS

## 2016-02-08 MED ORDER — ONDANSETRON HCL 4 MG/2ML IJ SOLN
INTRAMUSCULAR | Status: AC
  Administered 2016-02-08: 4 mg via INTRAVENOUS
  Filled 2016-02-08: qty 2

## 2016-02-08 MED ORDER — OXYCODONE HCL 5 MG PO TABS
5.0000 mg | ORAL_TABLET | Freq: Four times a day (QID) | ORAL | Status: DC | PRN
  Administered 2016-02-08 – 2016-02-09 (×2): 5 mg via ORAL
  Filled 2016-02-08 (×2): qty 1

## 2016-02-08 MED ORDER — HYDROMORPHONE HCL 1 MG/ML IJ SOLN
INTRAMUSCULAR | Status: AC
  Filled 2016-02-08: qty 1

## 2016-02-08 MED ORDER — ONDANSETRON HCL 4 MG/2ML IJ SOLN
4.0000 mg | Freq: Once | INTRAMUSCULAR | Status: AC
Start: 2016-02-08 — End: 2016-02-08
  Administered 2016-02-08: 4 mg via INTRAVENOUS

## 2016-02-08 NOTE — H&P (Signed)
[facility] -  at [facility]   PATIENT NAME: Kelly Peterson    MR#:  [id number]  DATE OF BIRTH:  [birth year]  DATE OF ADMISSION:  02/08/2016  PRIMARY CARE PHYSICIAN: [facility]   REQUESTING/REFERRING PHYSICIAN: Cyril Loosen, MD  CHIEF COMPLAINT:   Chief Complaint  Patient presents with  . Nausea  . Abdominal Pain    HISTORY OF PRESENT ILLNESS:  Kelly Peterson  is a 35 y.o. female who presents with Nausea and significant abdominal pain. Patient states her abdominal pain was initially right upper quadrant and then later suprapubic and pelvic. She states that this pain began after a pelvic exam today her gynecologist as part of an evaluation and workup for possible endometriosis. Here in the ED her pain was unable to be controlled despite multiple doses of pain medications. CT abdomen and pelvis was largely unremarkable. Hospitals were called for admission for observation for pain control.  PAST MEDICAL HISTORY:   Past Medical History:  Diagnosis Date  . GERD (gastroesophageal reflux disease)    NO MEDS  . Mood disorder (HCC) unk  . Stomach ulcer     PAST SURGICAL HISTORY:   Past Surgical History:  Procedure Laterality Date  . DILATION AND CURETTAGE OF UTERUS    . ESOPHAGOGASTRODUODENOSCOPY (EGD) WITH PROPOFOL N/A 01/23/2015   Procedure: ESOPHAGOGASTRODUODENOSCOPY (EGD) WITH PROPOFOL;  Surgeon: Christena Deem, MD;  Location: [facility] ENDOSCOPY;  Service: Endoscopy;  Laterality: N/A;    SOCIAL HISTORY:   Social History  Substance Use Topics  . Smoking status: Never Smoker  . Smokeless tobacco: Never Used  . Alcohol use No    FAMILY HISTORY:   Family History  Problem Relation Age of Onset  . Breast cancer Mother   . Bipolar disorder Father     DRUG ALLERGIES:   Allergies  Allergen Reactions  . Morphine And Related Itching and Rash    MEDICATIONS AT HOME:   Prior to Admission medications   Medication Sig Start  Date End Date Taking? Authorizing Provider  citalopram (CELEXA) 20 MG tablet Take 20 mg by mouth daily.    Historical Provider, MD  diazepam (VALIUM) 5 MG tablet Take 1 tablet (5 mg total) by mouth every 8 (eight) hours as needed for muscle spasms. 01/14/16   Emily Filbert, MD  gabapentin (NEURONTIN) 300 MG capsule Take 300 mg by mouth at bedtime.    Historical Provider, MD  oxyCODONE-acetaminophen (PERCOCET) 5-325 MG tablet Take 2 tablets by mouth every 6 (six) hours as needed for moderate pain or severe pain. 01/14/16   Emily Filbert, MD  traZODone (DESYREL) 50 MG tablet Take 50 mg by mouth at bedtime.    Historical Provider, MD    REVIEW OF SYSTEMS:  Review of Systems  Constitutional: Negative for chills, fever, malaise/fatigue and weight loss.  HENT: Negative for ear pain, hearing loss and tinnitus.   Eyes: Negative for blurred vision, double vision, pain and redness.  Respiratory: Negative for cough, hemoptysis and shortness of breath.   Cardiovascular: Negative for chest pain, palpitations, orthopnea and leg swelling.  Gastrointestinal: Positive for abdominal pain and nausea. Negative for constipation, diarrhea and vomiting.  Genitourinary: Negative for dysuria, frequency and hematuria.  Musculoskeletal: Negative for back pain, joint pain and neck pain.  Skin:       No acne, rash, or lesions  Neurological: Negative for dizziness, tremors, focal weakness and weakness.  Endo/Heme/Allergies: Negative for polydipsia. Does not bruise/bleed easily.  Psychiatric/Behavioral: Negative for  depression. The patient is not nervous/anxious and does not have insomnia.      VITAL SIGNS:   Vitals:   02/08/16 1723 02/08/16 1802 02/08/16 1936 02/08/16 2000  BP: 122/89 (!) 119/92 (!) 120/59 114/78  Pulse: 99 (!) 114 (!) 103 100  Resp: (!) 22 (!) 31 20 15   Temp: 99.2 F (37.3 C)     TempSrc: Oral  Oral   SpO2: 100% 100% 100% 98%   Wt Readings from Last 3 Encounters:  01/14/16 59 kg  (130 lb)  09/13/15 56.7 kg (125 lb)  01/23/15 65.8 kg (145 lb)    PHYSICAL EXAMINATION:  Physical Exam  Vitals reviewed. Constitutional: She is oriented to person, place, and time. She appears well-developed and well-nourished. She appears distressed.  HENT:  Head: Normocephalic and atraumatic.  Mouth/Throat: Oropharynx is clear and moist.  Eyes: Conjunctivae and EOM are normal. Pupils are equal, round, and reactive to light. No scleral icterus.  Neck: Normal range of motion. Neck supple. No JVD present. No thyromegaly present.  Cardiovascular: Normal rate, regular rhythm and intact distal pulses.  Exam reveals no gallop and no friction rub.   No murmur heard. Respiratory: Effort normal and breath sounds normal. No respiratory distress. She has no wheezes. She has no rales.  GI: Soft. Bowel sounds are normal. She exhibits no distension. There is tenderness.  Musculoskeletal: Normal range of motion. She exhibits no edema.  No arthritis, no gout  Lymphadenopathy:    She has no cervical adenopathy.  Neurological: She is alert and oriented to person, place, and time. No cranial nerve deficit.  No dysarthria, no aphasia  Skin: Skin is warm and dry. No rash noted. No erythema.  Psychiatric: She has a normal mood and affect. Her behavior is normal. Judgment and thought content normal.    LABORATORY PANEL:   CBC  Recent Labs Lab 02/08/16 1715  WBC 9.2  HGB 13.9  HCT 39.5  PLT 194   ------------------------------------------------------------------------------------------------------------------  Chemistries   Recent Labs Lab 02/08/16 1715  NA 137  K 3.9  CL 106  CO2 23  GLUCOSE 85  BUN 9  CREATININE 0.68  CALCIUM 9.1  AST 29  ALT 20  ALKPHOS 43  BILITOT 0.6   ------------------------------------------------------------------------------------------------------------------  Cardiac Enzymes No results for input(s): TROPONINI in the last 168  hours. ------------------------------------------------------------------------------------------------------------------  RADIOLOGY:  Ct Abdomen Pelvis W Contrast  Result Date: 02/08/2016 CLINICAL DATA:  Right lower quadrant abdomen pain after pelvic exam today. EXAM: CT ABDOMEN AND PELVIS WITH CONTRAST TECHNIQUE: Multidetector CT imaging of the abdomen and pelvis was performed using the standard protocol following bolus administration of intravenous contrast. CONTRAST:  ISOVUE-300 IOPAMIDOL (ISOVUE-300) INJECTION 61% COMPARISON:  April 12, 2012 FINDINGS: Lower chest: No acute abnormality. Hepatobiliary: No focal liver abnormality is seen. No gallstones, gallbladder wall thickening, or biliary dilatation. Pancreas: Unremarkable. No pancreatic ductal dilatation or surrounding inflammatory changes. Spleen: Normal in size without focal abnormality. Adrenals/Urinary Tract: Adrenal glands are unremarkable. The right kidney is normal, without renal calculi, focal lesion, or hydronephrosis. There is an 8 mm simple cyst in upper pole left kidney. There is no left hydronephrosis. Bladder is unremarkable. Stomach/Bowel: Stomach is within normal limits. The appendix is not seen but no inflammatory changes noted around the cecum. No evidence of bowel wall thickening, distention, or inflammatory changes. Extensive bowel content is identified throughout colon. Vascular/Lymphatic: No significant vascular findings are present. No enlarged abdominal or pelvic lymph nodes. Reproductive: There is heterogeneous enhancement of the uterine  wall probably due to myomatous uterus. The overall size and shape of the uterus is unchanged compared to prior noncontrast CT. Other: There is small umbilical herniation of mesenteric fat. There is no free air. Musculoskeletal: No acute or significant osseous findings. There is a stable small bone island in the left iliac wing unchanged. IMPRESSION: No acute abnormality identified in  the abdomen and pelvis. No bowel obstruction. Constipation. The appendix is not seen but no inflammation is noted around cecum. Electronically Signed   By: Sherian ReinWei-Chen  Lin M.D.   On: 02/08/2016 19:29    EKG:   Orders placed or performed in visit on 01/25/12  . EKG 12-Lead    IMPRESSION AND PLAN:  Principal Problem:   Intractable pain - when necessary pain meds as well as antiemetics on board tonight, we'll also give IV Toradol for possible anti-inflammatory effect. Active Problems:   Mood disorder (HCC) - continue home meds  All the records are reviewed and case discussed with ED provider. Management plans discussed with the patient and/or family.  DVT PROPHYLAXIS: SubQ lovenox  GI PROPHYLAXIS: None  ADMISSION STATUS: Observation  CODE STATUS: Full Code Status History    This patient does not have a recorded code status. Please follow your organizational policy for patients in this situation.      TOTAL TIME TAKING CARE OF THIS PATIENT: 40 minutes.    Rawleigh Rode FIELDING 02/08/2016, 8:48 PM  [facility]  Office  [phone]  CC: [facility] physician; [facility]

## 2016-02-08 NOTE — ED Notes (Signed)
Patient transported to CT 

## 2016-02-08 NOTE — ED Notes (Signed)
Pt ambulates to restroom without difficulty, steady gait noted.  

## 2016-02-08 NOTE — ED Notes (Signed)
Report received from [place].

## 2016-02-08 NOTE — ED Notes (Signed)
Pt called out and states she is in pain. Asked pt if she is wanting more pain medication. Pt states "I don't know if y'all want to give me more pain medication, I just want to go to sleep."

## 2016-02-08 NOTE — ED Triage Notes (Signed)
Pt states she saw clinic in [place] today and had a vaginal exam done. Pt is scheduled to go back to rule out endometriosis and appendicitis at a later date. Pt is tearful and crying out during triage. Pt states pain began 2 hours after seeing doctor. Denies vaginal bleeding, diarrhea, or fever. Has some nausea. EMS states she is spitting up but denies vomiting. Pt has heat pack to RLQ. Pt states she is supposed to start period in 1-2 days. Sees [facility].

## 2016-02-08 NOTE — ED Notes (Signed)
Pt vomiting up CT contrast. Dr Cyril Loosenkinner aware. Will scan without PO contrast.

## 2016-02-08 NOTE — ED Notes (Signed)
Drinking CT contrast.

## 2016-02-08 NOTE — ED Notes (Signed)
Answered pt's call light. Pt states pain medication did not work and she just wanted to lay in fetal position but could not due to all the cords. Pt assisted into a comfortable position and no other needs were expressed.

## 2016-02-08 NOTE — ED Notes (Signed)
Admitting doctor made aware of pt's pain and placed new order for pain medication

## 2016-02-08 NOTE — ED Notes (Addendum)
Pt requested to be taken off the the monitor to go to restroom. Pt asked if she needed help going to bathroom. Pt stated she did not need help. Pt observed going to restroom with steady gait and RN left the room for privacy. Pt called from restroom, states she felt off toilet. Pt found sitting on the floor. No obvious injuries noted.

## 2016-02-08 NOTE — ED Notes (Signed)
Pt requesting another nurse.

## 2016-02-08 NOTE — ED Provider Notes (Addendum)
[facility] Emergency Department Provider Note   ____________________________________________    I have reviewed the triage vital signs and the nursing notes.   HISTORY  Chief Complaint Nausea and Abdominal Pain     HPI Kelly Peterson is a 35 y.o. female who presents with complaints of right-sided abdominal pain which is severe and sharp in nature. She has had this several times in the past. She had an appointment at [facility] gynecology today and apparently received a pelvic exam and states the pain started 2 hours after that. She is being worked up for possible endometriosis. She claims of nausea and vomiting secondary to the pain. She is also concerned about her appendix.   Past Medical History:  Diagnosis Date  . Chronic kidney disease    STONES  . GERD (gastroesophageal reflux disease)    NO MEDS  . Mood disorder (HCC) unk  . Stomach ulcer     There are no active problems to display for this patient.   Past Surgical History:  Procedure Laterality Date  . DILATION AND CURETTAGE OF UTERUS    . ESOPHAGOGASTRODUODENOSCOPY (EGD) WITH PROPOFOL N/A 01/23/2015   Procedure: ESOPHAGOGASTRODUODENOSCOPY (EGD) WITH PROPOFOL;  Surgeon: Christena Deem, MD;  Location: [facility] ENDOSCOPY;  Service: Endoscopy;  Laterality: N/A;    Prior to Admission medications   Medication Sig Start Date End Date Taking? Authorizing Provider  citalopram (CELEXA) 20 MG tablet Take 20 mg by mouth daily.    Historical Provider, MD  diazepam (VALIUM) 5 MG tablet Take 1 tablet (5 mg total) by mouth every 8 (eight) hours as needed for muscle spasms. 01/14/16   Emily Filbert, MD  gabapentin (NEURONTIN) 300 MG capsule Take 300 mg by mouth at bedtime.    Historical Provider, MD  oxyCODONE-acetaminophen (PERCOCET) 5-325 MG tablet Take 2 tablets by mouth every 6 (six) hours as needed for moderate pain or severe pain. 01/14/16   Emily Filbert, MD  traZODone (DESYREL) 50 MG  tablet Take 50 mg by mouth at bedtime.    Historical Provider, MD     Allergies Morphine and related  History reviewed. No pertinent family history.  Social History Social History  Substance Use Topics  . Smoking status: Never Smoker  . Smokeless tobacco: Never Used  . Alcohol use No    Review of Systems  Constitutional: No fever/chills   Gastrointestinal: Right-sided abdominal pain   Genitourinary: Negative for dysuria. Musculoskeletal: Negative for back pain. Skin: Negative for rash.   10-point ROS otherwise negative.  ____________________________________________   PHYSICAL EXAM:  VITAL SIGNS: ED Triage Vitals   Enc Vitals Group     BP 122/89     Pulse Rate 99     Resp (!) 22     Temp 99.2 F (37.3 C)     Temp Source Oral     SpO2 100 %     Weight      Height      Head Circumference      Peak Flow      Pain Score 10     Pain Loc      Pain Edu?      Excl. in GC?     Constitutional: Alert and oriented, anxious, crying Eyes: Conjunctivae are normal.   Nose: No congestion/rhinnorhea. Mouth/Throat: Mucous membranes are moist.    Cardiovascular: Tachycardia, regular rhythm. Grossly normal heart sounds.  Good peripheral circulation. Respiratory: Normal respiratory effort.  No retractions. Lungs CTAB. Gastrointestinal: Moderate  tenderness to palpation right lower quadrant and right upper quadrant. No distention, no CVA tenderness. Genitourinary: Exam just performed earlier today Musculoskeletal: No lower extremity tenderness nor edema.  Warm and well perfused Neurologic:  Normal speech and language. No gross focal neurologic deficits are appreciated.  Skin:  Skin is warm, dry and intact. No rash noted. Psychiatric: Patient is extremely anxious  ____________________________________________   LABS (all labs ordered are listed, but only abnormal results are displayed)  Labs Reviewed  CBC WITH DIFFERENTIAL/PLATELET - Abnormal; Notable for the  following:       Result Value   Lymphs Abs 3.9 (*)    All other components within normal limits  URINALYSIS COMPLETEWITH MICROSCOPIC ([facility] ONLY) - Abnormal; Notable for the following:    Color, Urine STRAW (*)    APPearance CLEAR (*)    Specific Gravity, Urine 1.003 (*)    Squamous Epithelial / LPF 0-5 (*)    All other components within normal limits  COMPREHENSIVE METABOLIC PANEL  LIPASE, BLOOD  HCG, QUANTITATIVE, PREGNANCY  POCT PREGNANCY, URINE   ____________________________________________  EKG  None ____________________________________________  RADIOLOGY  CT abdomen pelvis unremarkable ____________________________________________   PROCEDURES  Procedure(s) performed: No    Critical Care performed:No ____________________________________________   INITIAL IMPRESSION / ASSESSMENT AND PLAN / ED COURSE  Pertinent labs & imaging results that were available during my care of the patient were reviewed by me and considered in my medical decision making (see chart for details).  Patient with a history of abdominal/pelvic pain, today's episode is similar to previous episodes. We will give IV pain medication, IV Zofran, IV fluids and obtain labs and CT abdomen and pelvis to evaluate.  Clinical Course  CT scan is unremarkable. Patient continued to have significant pain and has had multiple doses of IV Dilaudid without significant relief. Notably she is upset with her nurse who she blames for letting her get up to go to the bathroom because she became dizzy and apparently fell off of the toilet onto her right hip and now she has talked to her family who is a Clinical research associatelawyer and states she will file a Theatre managergrievance.   No significant injury noted on exam of right hip area, no imaging necessary ____________________________________________   FINAL CLINICAL IMPRESSION(S) / ED DIAGNOSES  Final diagnoses:  Right lower quadrant abdominal pain      NEW MEDICATIONS STARTED DURING THIS  VISIT:  New Prescriptions   No medications on file     Note:  This document was prepared using Dragon voice recognition software and may include unintentional dictation errors.    Jene Everyobert Lavanda Nevels, MD 02/08/16 [id number]    Jene Everyobert Maryon Kemnitz, MD 02/08/16 2119

## 2016-02-08 NOTE — ED Notes (Signed)
Responded to pt's call light, pt was very upset with nurse. Pt states she fell in the bathroom and now her pain is back and worse. Pt complains of pain to the right hip. Pt requests new nurse. Melony OverlyKuch, RN made aware states he was already aware of the fall.

## 2016-02-09 LAB — BASIC METABOLIC PANEL
Anion gap: 5 (ref 5–15)
BUN: 7 mg/dL (ref 6–20)
CHLORIDE: 108 mmol/L (ref 101–111)
CO2: 26 mmol/L (ref 22–32)
CREATININE: 0.76 mg/dL (ref 0.44–1.00)
Calcium: 7.9 mg/dL — ABNORMAL LOW (ref 8.9–10.3)
GFR calc Af Amer: >60 mL/min (ref >60)
GFR calc non Af Amer: >60 mL/min (ref >60)
GLUCOSE: 89 mg/dL (ref 65–99)
Potassium: 3.2 mmol/L — ABNORMAL LOW (ref 3.5–5.1)
SODIUM: 139 mmol/L (ref 135–145)

## 2016-02-09 LAB — CBC
HEMATOCRIT: 36.2 % (ref 35.0–47.0)
Hemoglobin: 12.5 g/dL (ref 12.0–16.0)
MCH: 29.5 pg (ref 26.0–34.0)
MCHC: 34.5 g/dL (ref 32.0–36.0)
MCV: 85.5 fL (ref 80.0–100.0)
PLATELETS: 169 10*3/uL (ref 150–440)
RBC: 4.23 MIL/uL (ref 3.80–5.20)
RDW: 12.6 % (ref 11.5–14.5)
WBC: 5.5 10*3/uL (ref 3.6–11.0)

## 2016-02-09 MED ORDER — SENNOSIDES-DOCUSATE SODIUM 8.6-50 MG PO TABS: 2.0000 | ORAL_TABLET | Freq: Two times a day (BID) | ORAL | 0 refills | Status: DC

## 2016-02-09 MED ORDER — SENNOSIDES-DOCUSATE SODIUM 8.6-50 MG PO TABS
2.0000 | ORAL_TABLET | Freq: Two times a day (BID) | ORAL | Status: DC
  Administered 2016-02-09: 2 via ORAL
  Filled 2016-02-09: qty 2

## 2016-02-09 MED ORDER — LACTULOSE 10 GM/15ML PO SOLN
30.0000 g | Freq: Two times a day (BID) | ORAL | Status: DC
  Administered 2016-02-09: 30 g via ORAL
  Filled 2016-02-09: qty 60

## 2016-02-09 MED ORDER — MAGNESIUM CITRATE PO SOLN
1.0000 | Freq: Once | ORAL | Status: AC
  Administered 2016-02-09: 1 via ORAL
  Filled 2016-02-09: qty 296

## 2016-02-09 MED ORDER — OXYCODONE HCL 5 MG PO TABS: 5.0000 mg | ORAL_TABLET | Freq: Four times a day (QID) | ORAL | 0 refills | Status: DC | PRN

## 2016-02-09 NOTE — Discharge Instructions (Signed)

## 2016-02-09 NOTE — Discharge Summary (Signed)
Kelly Peterson, 35 y.o., DOB August 30, 1980, MRN [id number]. Admission date: 02/08/2016 Discharge Date 02/09/2016 Primary MD [facility] Admitting Physician Oralia Manis, MD  Admission Diagnosis  Right lower quadrant abdominal pain [R10.31]  Discharge Diagnosis   Principal Problem:   Intractable pain due to pelvic floor muscle spasms   Mood disorder [facility])   Chronic pelvic pain        Hospital Course  Patient is a 34 year old female who actually was seen at [facility] yesterday for pelvic pain and was evaluated and had a pelvic exam done. She was prescribed some medications for this. And also is scheduled to have further evaluation for endometriosis. Came to our emergency room complaining of severe abdominal pain. Had a CT scan which just showed constipation. No other abnormality was noted she was admitted for pain control. This morning her pain is under better control. She reports that she has 4 children and has to go home.            Consults  None  Significant Tests:  See full reports for all details    US Transvaginal Non-ob  Result Date: 01/14/2016 CLINICAL DATA:  Right-sided pelvic pain for 1 week. EXAM: TRANSABDOMINAL AND TRANSVAGINAL ULTRASOUND OF PELVIS DOPPLER ULTRASOUND OF OVARIES TECHNIQUE: Both transabdominal and transvaginal ultrasound examinations of the pelvis were performed. Transabdominal technique was performed for global imaging of the pelvis including uterus, ovaries, adnexal regions, and pelvic cul-de-sac. It was necessary to proceed with endovaginal exam following the transabdominal exam to visualize the ovaries to better advantage. Color and duplex Doppler ultrasound was utilized to evaluate blood flow to the ovaries. COMPARISON:  09/13/2015 FINDINGS: Uterus Measurements: 10.4 x 4.8 x 5.7 cm. No fibroids or other mass visualized. Endometrium Thickness: 9 mm.  No focal abnormality visualized. Right ovary Measurements: 3.8 x 2.2 x 2.7 cm. Normal  appearance/no adnexal mass. Left ovary Measurements: 2.3 x 1.4 x 1.7 cm. Normal appearance/no adnexal mass. Pulsed Doppler evaluation of both ovaries demonstrates normal low-resistance arterial and venous waveforms. Other findings No abnormal free fluid. IMPRESSION: Normal transabdominal and endovaginal pelvic ultrasound. Electronically Signed   By: Amie Portland M.D.   On: 01/14/2016 12:30   US Pelvis Complete  Result Date: 01/14/2016 CLINICAL DATA:  Right-sided pelvic pain for 1 week. EXAM: TRANSABDOMINAL AND TRANSVAGINAL ULTRASOUND OF PELVIS DOPPLER ULTRASOUND OF OVARIES TECHNIQUE: Both transabdominal and transvaginal ultrasound examinations of the pelvis were performed. Transabdominal technique was performed for global imaging of the pelvis including uterus, ovaries, adnexal regions, and pelvic cul-de-sac. It was necessary to proceed with endovaginal exam following the transabdominal exam to visualize the ovaries to better advantage. Color and duplex Doppler ultrasound was utilized to evaluate blood flow to the ovaries. COMPARISON:  09/13/2015 FINDINGS: Uterus Measurements: 10.4 x 4.8 x 5.7 cm. No fibroids or other mass visualized. Endometrium Thickness: 9 mm.  No focal abnormality visualized. Right ovary Measurements: 3.8 x 2.2 x 2.7 cm. Normal appearance/no adnexal mass. Left ovary Measurements: 2.3 x 1.4 x 1.7 cm. Normal appearance/no adnexal mass. Pulsed Doppler evaluation of both ovaries demonstrates normal low-resistance arterial and venous waveforms. Other findings No abnormal free fluid. IMPRESSION: Normal transabdominal and endovaginal pelvic ultrasound. Electronically Signed   By: Amie Portland M.D.   On: 01/14/2016 12:30   Ct Abdomen Pelvis W Contrast  Result Date: 02/08/2016 CLINICAL DATA:  Right lower quadrant abdomen pain after pelvic exam today. EXAM: CT ABDOMEN AND PELVIS WITH CONTRAST TECHNIQUE: Multidetector CT imaging of the abdomen and pelvis was performed  using the standard protocol  following bolus administration of intravenous contrast. CONTRAST:  100mL ISOVUE-300 IOPAMIDOL (ISOVUE-300) INJECTION 61% COMPARISON:  April 12, 2012 FINDINGS: Lower chest: No acute abnormality. Hepatobiliary: No focal liver abnormality is seen. No gallstones, gallbladder wall thickening, or biliary dilatation. Pancreas: Unremarkable. No pancreatic ductal dilatation or surrounding inflammatory changes. Spleen: Normal in size without focal abnormality. Adrenals/Urinary Tract: Adrenal glands are unremarkable. The right kidney is normal, without renal calculi, focal lesion, or hydronephrosis. There is an 8 mm simple cyst in upper pole left kidney. There is no left hydronephrosis. Bladder is unremarkable. Stomach/Bowel: Stomach is within normal limits. The appendix is not seen but no inflammatory changes noted around the cecum. No evidence of bowel wall thickening, distention, or inflammatory changes. Extensive bowel content is identified throughout colon. Vascular/Lymphatic: No significant vascular findings are present. No enlarged abdominal or pelvic lymph nodes. Reproductive: There is heterogeneous enhancement of the uterine wall probably due to myomatous uterus. The overall size and shape of the uterus is unchanged compared to prior noncontrast CT. Other: There is small umbilical herniation of mesenteric fat. There is no free air. Musculoskeletal: No acute or significant osseous findings. There is a stable small bone island in the left iliac wing unchanged. IMPRESSION: No acute abnormality identified in the abdomen and pelvis. No bowel obstruction. Constipation. The appendix is not seen but no inflammation is noted around cecum. Electronically Signed   By: Sherian ReinWei-Chen  Lin M.D.   On: 02/08/2016 19:29   [place] Art/ven Flow Abd Pelv Doppler  Result Date: 01/14/2016 CLINICAL DATA:  Right-sided pelvic pain for 1 week. EXAM: TRANSABDOMINAL AND TRANSVAGINAL ULTRASOUND OF PELVIS DOPPLER ULTRASOUND OF OVARIES TECHNIQUE: Both  transabdominal and transvaginal ultrasound examinations of the pelvis were performed. Transabdominal technique was performed for global imaging of the pelvis including uterus, ovaries, adnexal regions, and pelvic cul-de-sac. It was necessary to proceed with endovaginal exam following the transabdominal exam to visualize the ovaries to better advantage. Color and duplex Doppler ultrasound was utilized to evaluate blood flow to the ovaries. COMPARISON:  09/13/2015 FINDINGS: Uterus Measurements: 10.4 x 4.8 x 5.7 cm. No fibroids or other mass visualized. Endometrium Thickness: 9 mm.  No focal abnormality visualized. Right ovary Measurements: 3.8 x 2.2 x 2.7 cm. Normal appearance/no adnexal mass. Left ovary Measurements: 2.3 x 1.4 x 1.7 cm. Normal appearance/no adnexal mass. Pulsed Doppler evaluation of both ovaries demonstrates normal low-resistance arterial and venous waveforms. Other findings No abnormal free fluid. IMPRESSION: Normal transabdominal and endovaginal pelvic ultrasound. Electronically Signed   By: Amie Portlandavid  Ormond M.D.   On: 01/14/2016 12:30   [place] Abdomen Limited Ruq  Result Date: 01/14/2016 CLINICAL DATA:  35 year old female with right upper quadrant abdominal pain for 4 days. EXAM: US ABDOMEN LIMITED - RIGHT UPPER QUADRANT COMPARISON:  08/08/2013 CT and 12/14/2011 ultrasound FINDINGS: Gallbladder: The gallbladder is unremarkable. There is no evidence of cholelithiasis or acute cholecystitis. Common bile duct: Diameter: 4.2 mm. There is no evidence of intrahepatic or extrahepatic biliary dilatation. Liver: No focal lesion identified. Within normal limits in parenchymal echogenicity. IMPRESSION: Normal right upper quadrant abdominal ultrasound. Electronically Signed   By: Harmon PierJeffrey  Hu M.D.   On: 01/14/2016 12:35       Today   Subjective:   Alfred LevinsJennifer Mcanany still has some pain but now improved in the pelvic region  Objective:   Blood pressure (!) 93/55, pulse 74, temperature 97.5 F (36.4 C),  temperature source Oral, resp. rate 18, height 5\' 3"  (1.6 m), weight 127 lb 11.2  oz (57.9 kg), last menstrual period 12/15/2015, SpO2 100 %.  .  Intake/Output Summary (Last 24 hours) at 02/09/16 1409 Last data filed at 02/09/16 0900  Gross per 24 hour  Intake              690 ml  Output              850 ml  Net             -160 ml    Exam VITAL SIGNS: Blood pressure (!) 93/55, pulse 74, temperature 97.5 F (36.4 C), temperature source Oral, resp. rate 18, height 5\' 3"  (1.6 m), weight 127 lb 11.2 oz (57.9 kg), last menstrual period 12/15/2015, SpO2 100 %.  GENERAL:  35 y.o.-year-old patient lying in the bed with no acute distress.  EYES: Pupils equal, round, reactive to light and accommodation. No scleral icterus. Extraocular muscles intact.  HEENT: Head atraumatic, normocephalic. Oropharynx and nasopharynx clear.  NECK:  Supple, no jugular venous distention. No thyroid enlargement, no tenderness.  LUNGS: Normal breath sounds bilaterally, no wheezing, rales,rhonchi or crepitation. No use of accessory muscles of respiration.  CARDIOVASCULAR: S1, S2 normal. No murmurs, rubs, or gallops.  ABDOMEN: Soft, nontender, nondistended. Bowel sounds present. No organomegaly or mass.  EXTREMITIES: No pedal edema, cyanosis, or clubbing.  NEUROLOGIC: Cranial nerves II through XII are intact. Muscle strength 5/5 in all extremities. Sensation intact. Gait not checked.  PSYCHIATRIC: The patient is alert and oriented x 3.  SKIN: No obvious rash, lesion, or ulcer.   Data Review     CBC w Diff: Lab Results  Component Value Date   WBC 5.5 02/09/2016   HGB 12.5 02/09/2016   HGB 15.2 11/15/2013   HCT 36.2 02/09/2016   HCT 44.8 11/15/2013   PLT 169 02/09/2016   PLT 262 11/15/2013   LYMPHOPCT 42 02/08/2016   LYMPHOPCT 30.9 08/07/2013   MONOPCT 7 02/08/2016   MONOPCT 6.3 08/07/2013   EOSPCT 1 02/08/2016   EOSPCT 1.1 08/07/2013   BASOPCT 0 02/08/2016   BASOPCT 0.4 08/07/2013   CMP: Lab Results   Component Value Date   NA 139 02/09/2016   NA 140 11/15/2013   K 3.2 (L) 02/09/2016   K 3.7 11/15/2013   CL 108 02/09/2016   CL 106 11/15/2013   CO2 26 02/09/2016   CO2 25 11/15/2013   BUN 7 02/09/2016   BUN 7 11/15/2013   CREATININE 0.76 02/09/2016   CREATININE 0.89 11/15/2013   PROT 7.2 02/08/2016   PROT 8.1 11/15/2013   ALBUMIN 4.3 02/08/2016   ALBUMIN 4.1 11/15/2013   BILITOT 0.6 02/08/2016   BILITOT 0.2 11/15/2013   ALKPHOS 43 02/08/2016   ALKPHOS 87 11/15/2013   AST 29 02/08/2016   AST 30 11/15/2013   ALT 20 02/08/2016   ALT 29 11/15/2013  .  Micro Results No results found for this or any previous visit (from the past 240 hour(s)).      Code Status Orders        Start     Ordered   02/08/16 2211  Full code  Continuous     02/08/16 2210    Code Status History    Date Active Date Inactive Code Status Order ID Comments User Context   This patient has a current code status but no historical code status.          Follow-up Information    [facility] On 02/25/2016.   Specialty:  General Practice  Why:  Thursday at 1:30pm for hospital follow-up Contact information: [address] [place] [address] [phone]           Discharge Medications     Medication List    TAKE these medications   diazepam 5 MG tablet Commonly known as:  VALIUM Take 1 tablet (5 mg total) by mouth every 8 (eight) hours as needed for muscle spasms. What changed:  when to take this   gabapentin 300 MG capsule Commonly known as:  NEURONTIN Take 300 mg by mouth 3 (three) times daily.   oxyCODONE 5 MG immediate release tablet Commonly known as:  Oxy IR/ROXICODONE Take 1 tablet (5 mg total) by mouth every 6 (six) hours as needed for moderate pain.   senna-docusate 8.6-50 MG tablet Commonly known as:  Senokot-S Take 2 tablets by mouth 2 (two) times daily.   traZODone 50 MG tablet Commonly known as:  DESYREL Take 50 mg by mouth at  bedtime.          Total Time in preparing paper work, data evaluation and todays exam - 35 minutes  Auburn Bilberry M.D on 02/09/2016 at 2:09 [facility]  [facility]   Office  [phone]

## 2016-02-09 NOTE — Progress Notes (Signed)
02/09/2016 11:41 AM  BP (!) 93/55 (BP Location: Left Arm) Comment: rn was aware of bp (cshaw )  Pulse 74   Temp 97.5 F (36.4 C) (Oral)   Resp 18   Ht 5\' 3"  (1.6 m)   Wt 57.9 kg (127 lb 11.2 oz)   LMP 12/15/2015 Comment: endometriosis  SpO2 100%   BMI 22.62 kg/m  Patient discharged per MD orders. Discharge instructions reviewed with patient and patient verbalized understanding. IV removed per policy. Prescriptions discussed and given to patient. Discharged ambulatory escorted by family.  Ron ParkerHerron, Jeany Seville D, RN

## 2016-03-08 ENCOUNTER — Emergency Department: Payer: Medicaid Other

## 2016-03-08 ENCOUNTER — Emergency Department
Admission: EM | Admit: 2016-03-08 | Discharge: 2016-03-08 | Disposition: A | Discharge status: Home / Self Care | Payer: Medicaid Other | Attending: Emergency Medicine | Admitting: Emergency Medicine

## 2016-03-08 ENCOUNTER — Encounter: Payer: Self-pay | Admitting: Emergency Medicine

## 2016-03-08 DIAGNOSIS — F419 Anxiety disorder, unspecified: Secondary | ICD-10-CM | POA: Insufficient documentation

## 2016-03-08 DIAGNOSIS — R109 Unspecified abdominal pain: Secondary | ICD-10-CM

## 2016-03-08 DIAGNOSIS — Z79899 Other long term (current) drug therapy: Secondary | ICD-10-CM | POA: Insufficient documentation

## 2016-03-08 DIAGNOSIS — R1011 Right upper quadrant pain: Secondary | ICD-10-CM | POA: Diagnosis present

## 2016-03-08 LAB — POCT PREGNANCY, URINE: Preg Test, Ur: NEGATIVE

## 2016-03-08 LAB — COMPREHENSIVE METABOLIC PANEL
ALT: 15 U/L (ref 14–54)
ANION GAP: 6 (ref 5–15)
AST: 23 U/L (ref 15–41)
Albumin: 4.2 g/dL (ref 3.5–5.0)
Alkaline Phosphatase: 44 U/L (ref 38–126)
BILIRUBIN TOTAL: 0.3 mg/dL (ref 0.3–1.2)
BUN: 10 mg/dL (ref 6–20)
CHLORIDE: 107 mmol/L (ref 101–111)
CO2: 25 mmol/L (ref 22–32)
Calcium: 9 mg/dL (ref 8.9–10.3)
Creatinine, Ser: 0.69 mg/dL (ref 0.44–1.00)
Glucose, Bld: 115 mg/dL — ABNORMAL HIGH (ref 65–99)
POTASSIUM: 3.4 mmol/L — AB (ref 3.5–5.1)
Sodium: 138 mmol/L (ref 135–145)
TOTAL PROTEIN: 7.1 g/dL (ref 6.5–8.1)

## 2016-03-08 LAB — CBC WITH DIFFERENTIAL/PLATELET
BASOS ABS: 0 10*3/uL (ref 0–0.1)
Basophils Relative: 0 %
EOS PCT: 2 %
Eosinophils Absolute: 0.1 10*3/uL (ref 0–0.7)
HEMATOCRIT: 36.7 % (ref 35.0–47.0)
Hemoglobin: 12.8 g/dL (ref 12.0–16.0)
LYMPHS PCT: 50 %
Lymphs Abs: 3.4 10*3/uL (ref 1.0–3.6)
MCH: 29.6 pg (ref 26.0–34.0)
MCHC: 35 g/dL (ref 32.0–36.0)
MCV: 84.5 fL (ref 80.0–100.0)
MONO ABS: 0.6 10*3/uL (ref 0.2–0.9)
MONOS PCT: 8 %
Neutro Abs: 2.7 10*3/uL (ref 1.4–6.5)
Neutrophils Relative %: 40 %
PLATELETS: 180 10*3/uL (ref 150–440)
RBC: 4.34 MIL/uL (ref 3.80–5.20)
RDW: 12.5 % (ref 11.5–14.5)
WBC: 6.8 10*3/uL (ref 3.6–11.0)

## 2016-03-08 LAB — URINALYSIS COMPLETE WITH MICROSCOPIC (ARMC ONLY)
BILIRUBIN URINE: NEGATIVE
Bacteria, UA: NONE SEEN
Glucose, UA: NEGATIVE mg/dL
KETONES UR: NEGATIVE mg/dL
LEUKOCYTES UA: NEGATIVE
Nitrite: NEGATIVE
PH: 8 (ref 5.0–8.0)
PROTEIN: NEGATIVE mg/dL
SPECIFIC GRAVITY, URINE: 1.002 — AB (ref 1.005–1.030)

## 2016-03-08 LAB — LIPASE, BLOOD: LIPASE: 23 U/L (ref 11–51)

## 2016-03-08 MED ORDER — DIAZEPAM 5 MG PO TABS: 5.0000 mg | ORAL_TABLET | Freq: Three times a day (TID) | ORAL | 0 refills | Status: DC | PRN

## 2016-03-08 MED ORDER — LORAZEPAM 2 MG/ML IJ SOLN
1.0000 mg | Freq: Once | INTRAMUSCULAR | Status: AC
  Administered 2016-03-08: 1 mg via INTRAVENOUS

## 2016-03-08 MED ORDER — LORAZEPAM 2 MG/ML IJ SOLN
INTRAMUSCULAR | Status: AC
  Filled 2016-03-08: qty 1

## 2016-03-08 MED ORDER — SODIUM CHLORIDE 0.9 % IV BOLUS (SEPSIS)
1000.0000 mL | Freq: Once | INTRAVENOUS | Status: AC
  Administered 2016-03-08: 1000 mL via INTRAVENOUS

## 2016-03-08 MED ORDER — HYDROMORPHONE HCL 1 MG/ML IJ SOLN
1.0000 mg | Freq: Once | INTRAMUSCULAR | Status: AC
  Administered 2016-03-08: 1 mg via INTRAVENOUS
  Filled 2016-03-08: qty 1

## 2016-03-08 MED ORDER — DIAZEPAM 5 MG/ML IJ SOLN: 5.0000 mg | Freq: Once | INTRAMUSCULAR | Status: DC

## 2016-03-08 MED ORDER — OXYCODONE-ACETAMINOPHEN 5-325 MG PO TABS: 1.0000 | ORAL_TABLET | Freq: Four times a day (QID) | ORAL | 0 refills | Status: DC | PRN

## 2016-03-08 MED ORDER — HALOPERIDOL LACTATE 5 MG/ML IJ SOLN
5.0000 mg | Freq: Once | INTRAMUSCULAR | Status: AC
  Administered 2016-03-08: 5 mg via INTRAVENOUS
  Filled 2016-03-08: qty 1

## 2016-03-08 MED ORDER — PROMETHAZINE HCL 25 MG/ML IJ SOLN
25.0000 mg | Freq: Once | INTRAMUSCULAR | Status: AC
  Administered 2016-03-08: 25 mg via INTRAVENOUS
  Filled 2016-03-08: qty 1

## 2016-03-08 NOTE — Discharge Instructions (Signed)
Take motrin for pain.   Take percocet for severe pain. DO NOT drive with it.   Take valium for spasms.   See your doctor. See your GYN doctor for laparoscopy as scheduled.   Return to ER if you have severe pain, vomiting, fever

## 2016-03-08 NOTE — ED Triage Notes (Signed)
Pain 10/10 Right lower quad

## 2016-03-08 NOTE — ED Provider Notes (Signed)
[facility]-EMERGENCY DEPARTMENT Provider Note   CSN: [id number] Arrival date & time: 03/08/16  2052     History   Chief Complaint Chief Complaint  Patient presents with  . Abdominal Pain    HPI Kelly Peterson is a 35 y.o. female history of reflux, stomach ulcer, endometriosis, kidney stones here presenting with right upper quadrant pain. Patient states that she was watching TV around 7 PM, then has sudden onset of sharp right upper quadrant pain. A radiated to the right flank area. He is currently on her menstrual period so she cannot tell me if she has hematuria or not. Patient states that she has history of endometriosis and is supposed to get surgery done next month.      The history is provided by the patient.    Past Medical History:  Diagnosis Date  . GERD (gastroesophageal reflux disease)    NO MEDS  . Mood disorder (HCC) unk  . Stomach ulcer     Patient Active Problem List   Diagnosis Date Noted  . Intractable pain 02/08/2016  . Mood disorder (HCC) 02/08/2016    Past Surgical History:  Procedure Laterality Date  . DILATION AND CURETTAGE OF UTERUS    . ESOPHAGOGASTRODUODENOSCOPY (EGD) WITH PROPOFOL N/A 01/23/2015   Procedure: ESOPHAGOGASTRODUODENOSCOPY (EGD) WITH PROPOFOL;  Surgeon: Christena Deem, MD;  Location[facility]COPY;  Service: Endoscopy;  Laterality: N/A;    OB History    Gravida Para Term Preterm AB Living   3         3   SAB TAB Ectopic Multiple Live Births                   Home Medications    Prior to Admission medications   Medication Sig Start Date End Date Taking? Authorizing Provider  diazepam (VALIUM) 5 MG tablet Take 1 tablet (5 mg total) by mouth every 8 (eight) hours as needed for muscle spasms. Patient taking differently: Take 5 mg by mouth at bedtime.  01/14/16   Emily Filbert, MD  gabapentin (NEURONTIN) 300 MG capsule Take 300 mg by mouth 3 (three) times daily.     Historical Provider, MD  oxyCODONE (OXY IR/ROXICODONE)  5 MG immediate release tablet Take 1 tablet (5 mg total) by mouth every 6 (six) hours as needed for moderate pain. 02/09/16   Auburn Bilberry, MD  senna-docusate (SENOKOT-S) 8.6-50 MG tablet Take 2 tablets by mouth 2 (two) times daily. 02/09/16   Auburn Bilberry, MD  traZODone (DESYREL) 50 MG tablet Take 50 mg by mouth at bedtime.    Historical Provider, MD    Family History Family History  Problem Relation Age of Onset  . Breast cancer Mother   . Bipolar disorder Father     Social History Social History  Substance Use Topics  . Smoking status: Never Smoker  . Smokeless tobacco: Never Used  . Alcohol use No     Allergies   Morphine and related   Review of Systems Review of Systems  Gastrointestinal: Positive for abdominal pain.  All other systems reviewed and are negative.    Physical Exam Updated Vital Signs BP 129/87   Pulse 89   Temp 99.7 F (37.6 C) (Oral)   Resp 20   Ht 5\' 3"  (1.6 m)   Wt 125 lb (56.7 kg)   SpO2 100%   BMI 22.14 kg/m   Physical Exam  Constitutional: She is oriented to person, place, and time.  Uncomfortable, writhing around in  pain   HENT:  Head: Normocephalic.  Eyes: EOM are normal. Pupils are equal, round, and reactive to light.  Neck: Normal range of motion. Neck supple.  Cardiovascular: Normal rate, regular rhythm and normal heart sounds.   Pulmonary/Chest: Effort normal and breath sounds normal. No respiratory distress. She has no wheezes. She has no rales.  Abdominal: Soft. Bowel sounds are normal.  + RUQ tenderness, + R CVAT   Musculoskeletal: Normal range of motion.  Neurological: She is alert and oriented to person, place, and time.  Psychiatric:  Anxious   Nursing note and vitals reviewed.    ED Treatments / Results  Labs (all labs ordered are listed, but only abnormal results are displayed) Labs Reviewed  COMPREHENSIVE METABOLIC PANEL - Abnormal; Notable for the following:       Result Value   Potassium 3.4 (*)     Glucose, Bld 115 (*)    All other components within normal limits  URINALYSIS COMPLETEWITH MICROSCOPIC ([facility] ONLY) - Abnormal; Notable for the following:    Color, Urine COLORLESS (*)    APPearance CLEAR (*)    Specific Gravity, Urine 1.002 (*)    Hgb urine dipstick 2+ (*)    Squamous Epithelial / LPF 0-5 (*)    All other components within normal limits  CBC WITH DIFFERENTIAL/PLATELET  LIPASE, BLOOD  POC URINE PREG, ED  POCT PREGNANCY, URINE    EKG  EKG Interpretation None       Radiology Ct Renal Stone Study  Result Date: 03/08/2016 CLINICAL DATA:  [facility] EXAM: CT ABDOMEN AND PELVIS WITHOUT CONTRAST TECHNIQUE: Multidetector CT imaging of the abdomen and pelvis was performed following the standard protocol without IV contrast. COMPARISON:  None. FINDINGS: Lower chest: No acute abnormality. Hepatobiliary: No focal liver abnormality is seen. No gallstones, gallbladder wall thickening, or biliary dilatation. Pancreas: Unremarkable. No pancreatic ductal dilatation or surrounding inflammatory changes. Spleen: Normal in size without focal abnormality. Adrenals/Urinary Tract: The adrenal glands are normal. Bilateral nephrolithiasis without hydronephrosis. The largest is seen in the right upper pole measuring 3 mm. Previously described cyst in the upper pole left kidney is not well visualized given lack of IV contrast. Stomach/Bowel: The appendix is not well visualized. No pericecal inflammation however. There is a moderate amount of fecal retention throughout large bowel consistent with constipation. No acute inflammatory process. No bowel obstruction. Vascular/Lymphatic: No significant vascular findings are present. No enlarged abdominal or pelvic lymph nodes. Reproductive: Uterus and bilateral adnexa are unremarkable. Other: Stable bilateral pelvic phleboliths. Trace pelvic free fluid in the cul-de-sac. Musculoskeletal: No acute osseous abnormality. Two small bone islands in the right acetabulum  and left femoral head. IMPRESSION: Bilateral nephrolithiasis without obstructive uropathy. Increased colonic stool burden suggesting constipation. Appendix is not well visualized but no pericecal inflammation is seen. Electronically Signed   By: Tollie Eth M.D.   On: 03/08/2016 22:36    Procedures Procedures (including critical care time)  Medications Ordered in ED Medications  sodium chloride 0.9 % bolus 1,000 mL (0 mLs Intravenous Stopped 03/08/16 2233)  HYDROmorphone (DILAUDID) injection 1 mg (1 mg Intravenous Given 03/08/16 2105)  promethazine (PHENERGAN) injection 25 mg (25 mg Intravenous Given 03/08/16 2104)  LORazepam (ATIVAN) injection 1 mg (1 mg Intravenous Given 03/08/16 2112)  HYDROmorphone (DILAUDID) injection 1 mg (1 mg Intravenous Given 03/08/16 2244)  haloperidol lactate (HALDOL) injection 5 mg (5 mg Intravenous Given 03/08/16 2244)     Initial Impression / Assessment and Plan / ED Course  I have reviewed  the triage vital signs and the nursing notes.  Pertinent labs & imaging results that were available during my care of the patient were reviewed by me and considered in my medical decision making (see chart for details).  Clinical Course    ,Kelly NoonJennifer L Peterson is a 35 y.o. female here with RUQ pain, R flank pain. Likely renal colic vs biliary colic. Hx of kidney stones with similar pain so will get CT renal stone. Recent admission for intractable pain with nl CT ab/pel recently so consider endometriosis as well. Will give pain meds and reassess.   10:57 PM Labs unremarkable. UA showed blood but she is on her period. CT showed intrarenal stone with no hydro. Felt better after pain meds. Likely worsening endometriosis since she is on her period. I check [place] database and last filled 20 vicodin on 09/13/15. Has percocet prescribed after last discharge but didn't show up on [place] database. Will give percocet, valium. Has laparoscopy scheduled on 11/8 with GYN. Will dc home   Final  Clinical Impressions(s) / ED Diagnoses   Final diagnoses:  None    New Prescriptions New Prescriptions   No medications on file     Charlynne Panderavid Hsienta Yao, MD 03/08/16 2259

## 2016-03-14 ENCOUNTER — Emergency Department
Admission: EM | Admit: 2016-03-14 | Discharge: 2016-03-14 | Disposition: A | Discharge status: Home / Self Care | Payer: Medicaid Other | Attending: Emergency Medicine | Admitting: Emergency Medicine

## 2016-03-14 ENCOUNTER — Encounter: Payer: Self-pay | Admitting: Emergency Medicine

## 2016-03-14 DIAGNOSIS — R1031 Right lower quadrant pain: Secondary | ICD-10-CM

## 2016-03-14 DIAGNOSIS — Z79899 Other long term (current) drug therapy: Secondary | ICD-10-CM | POA: Diagnosis not present

## 2016-03-14 HISTORY — DX: Endometriosis, unspecified: N80.9

## 2016-03-14 LAB — COMPREHENSIVE METABOLIC PANEL
ALT: 16 U/L (ref 14–54)
ANION GAP: 7 (ref 5–15)
AST: 24 U/L (ref 15–41)
Albumin: 4.6 g/dL (ref 3.5–5.0)
Alkaline Phosphatase: 46 U/L (ref 38–126)
BILIRUBIN TOTAL: 0.2 mg/dL — AB (ref 0.3–1.2)
BUN: 8 mg/dL (ref 6–20)
CO2: 26 mmol/L (ref 22–32)
Calcium: 9.5 mg/dL (ref 8.9–10.3)
Chloride: 99 mmol/L — ABNORMAL LOW (ref 101–111)
Creatinine, Ser: 0.82 mg/dL (ref 0.44–1.00)
GFR calc Af Amer: >60 mL/min (ref >60)
Glucose, Bld: 122 mg/dL — ABNORMAL HIGH (ref 65–99)
POTASSIUM: 3.8 mmol/L (ref 3.5–5.1)
Sodium: 132 mmol/L — ABNORMAL LOW (ref 135–145)
TOTAL PROTEIN: 7.5 g/dL (ref 6.5–8.1)

## 2016-03-14 LAB — CBC
HEMATOCRIT: 40.1 % (ref 35.0–47.0)
HEMOGLOBIN: 14 g/dL (ref 12.0–16.0)
MCH: 29.5 pg (ref 26.0–34.0)
MCHC: 34.9 g/dL (ref 32.0–36.0)
MCV: 84.4 fL (ref 80.0–100.0)
Platelets: 168 10*3/uL (ref 150–440)
RBC: 4.75 MIL/uL (ref 3.80–5.20)
RDW: 13 % (ref 11.5–14.5)
WBC: 3.9 10*3/uL (ref 3.6–11.0)

## 2016-03-14 LAB — LIPASE, BLOOD: Lipase: 22 U/L (ref 11–51)

## 2016-03-14 MED ORDER — ONDANSETRON HCL 4 MG/2ML IJ SOLN
4.0000 mg | Freq: Once | INTRAMUSCULAR | Status: AC
  Administered 2016-03-14: 4 mg via INTRAVENOUS
  Filled 2016-03-14: qty 2

## 2016-03-14 MED ORDER — OXYCODONE-ACETAMINOPHEN 5-325 MG PO TABS: 2.0000 | ORAL_TABLET | Freq: Four times a day (QID) | ORAL | 0 refills | Status: DC | PRN

## 2016-03-14 MED ORDER — ONDANSETRON HCL 4 MG/2ML IJ SOLN
INTRAMUSCULAR | Status: AC
  Filled 2016-03-14: qty 2

## 2016-03-14 MED ORDER — HYDROMORPHONE HCL 1 MG/ML IJ SOLN
2.0000 mg | Freq: Once | INTRAMUSCULAR | Status: AC
  Administered 2016-03-14: 2 mg via INTRAVENOUS
  Filled 2016-03-14: qty 2

## 2016-03-14 MED ORDER — FENTANYL CITRATE (PF) 100 MCG/2ML IJ SOLN
50.0000 ug | Freq: Once | INTRAMUSCULAR | Status: AC
Start: 2016-03-14 — End: 2016-03-14
  Administered 2016-03-14: 50 ug via INTRAVENOUS
  Filled 2016-03-14: qty 2

## 2016-03-14 MED ORDER — FENTANYL CITRATE (PF) 100 MCG/2ML IJ SOLN
INTRAMUSCULAR | Status: AC
  Administered 2016-03-14: 50 ug via INTRAVENOUS
  Filled 2016-03-14: qty 2

## 2016-03-14 NOTE — ED Triage Notes (Signed)
C/o dizziness x 1 day.  C/O RLQ and Right Flank pain x 90 minutes.  Patient states she has history of endometriosis with surgery scheduled for November 8.

## 2016-03-14 NOTE — ED Provider Notes (Signed)
[facility] Emergency Department Provider Note        Time seen: ----------------------------------------- 12:42 PM on 03/14/2016 -----------------------------------------    I have reviewed the triage vital signs and the nursing notes.   HISTORY  Chief Complaint Abdominal Pain    HPI Kelly Peterson is a 35 y.o. female who presents to the ER for dizziness for the last 24 hours. Patient is complaining of severe right lower quadrant and right flank pain for the last 90 minutes. She states she has history of endometriosis and has surgery scheduled in 9 days.Patient states she was taking tramadol prescribed by her doctor but this has not alleviated her symptoms. She was scheduled to have a hysterectomy in the past but she states she backed out.   Past Medical History:  Diagnosis Date  . Endometriosis   . GERD (gastroesophageal reflux disease)    NO MEDS  . Mood disorder (HCC) unk  . Stomach ulcer     Patient Active Problem List   Diagnosis Date Noted  . Intractable pain 02/08/2016  . Mood disorder (HCC) 02/08/2016    Past Surgical History:  Procedure Laterality Date  . DILATION AND CURETTAGE OF UTERUS    . ESOPHAGOGASTRODUODENOSCOPY (EGD) WITH PROPOFOL N/A 01/23/2015   Procedure: ESOPHAGOGASTRODUODENOSCOPY (EGD) WITH PROPOFOL;  Surgeon: Christena DeemMartin U Skulskie, Kelly Peterson;  [facility];  Service: Endoscopy;  Laterality: N/A;    Allergies Morphine and related  Social History Social History  Substance Use Topics  . Smoking status: Never Smoker  . Smokeless tobacco: Never Used  . Alcohol use No    Review of Systems Constitutional: Negative for fever. Cardiovascular: Negative for chest pain. Respiratory: Negative for shortness of breath. Gastrointestinal: Positive for abdominal pain Genitourinary: Negative for dysuria. Musculoskeletal: Negative for back pain. Skin: Negative for rash. Neurological: Negative for headaches, positive for  weakness  10-point ROS otherwise negative.  ____________________________________________   PHYSICAL EXAM:  VITAL SIGNS: ED Triage Vitals  Enc Vitals Group     BP 03/14/16 1131 (!) 114/96     Pulse Rate 03/14/16 1131 (!) 101     Resp 03/14/16 1131 18     Temp 03/14/16 1131 98 F (36.7 C)     Temp Source 03/14/16 1131 Oral     SpO2 03/14/16 1131 100 %     Weight 03/14/16 1132 123 lb (55.8 kg)     Height 03/14/16 1132 5\' 4"  (1.626 m)     Head Circumference --      Peak Flow --      Pain Score 03/14/16 1132 9     Pain Loc --      Pain Edu? --      Excl. in GC? --     Constitutional: Alert and oriented. Moderate distress Eyes: Conjunctivae are normal. PERRL. Normal extraocular movements. ENT   Head: Normocephalic and atraumatic.   Nose: No congestion/rhinnorhea.   Mouth/Throat: Mucous membranes are moist.   Neck: No stridor. Cardiovascular: Normal rate, regular rhythm. No murmurs, rubs, or gallops. Respiratory: Normal respiratory effort without tachypnea nor retractions. Breath sounds are clear and equal bilaterally. No wheezes/rales/rhonchi. Gastrointestinal: Right lower quadrant tenderness, no rebound or guarding. Normal bowel sounds. Musculoskeletal: Nontender with normal range of motion in all extremities. No lower extremity tenderness nor edema. Neurologic:  Normal speech and language. No gross focal neurologic deficits are appreciated.  Skin:  Skin is warm, dry and intact. No rash noted. Psychiatric: Anxious ____________________________________________  ED COURSE:  Pertinent labs & imaging  results that were available during my care of the patient were reviewed by me and considered in my medical decision making (see chart for details). Clinical Course  Patient was seen in the ER 6 days ago for abdominal pain, had a negative workup with CT imaging except for constipation. We'll assess with labs and give IV pain  medicine.  Procedures ____________________________________________   LABS (pertinent positives/negatives)  Labs Reviewed  COMPREHENSIVE METABOLIC PANEL - Abnormal; Notable for the following:       Result Value   Sodium 132 (*)    Chloride 99 (*)    Glucose, Bld 122 (*)    Total Bilirubin 0.2 (*)    All other components within normal limits  LIPASE, BLOOD  CBC  URINALYSIS COMPLETEWITH MICROSCOPIC ([facility] ONLY)   ____________________________________________  FINAL ASSESSMENT AND PLAN  Abdominal pain  Plan: Patient with labs and imaging as dictated above. Patient has noted improvement in her pain. She has had numerous workups for this in the past. I do not see any evidence that warrants further imaging or workup. She is stable for outpatient follow-up for her hysterectomy.   Kelly Peterson, Kelly Peterson, Kelly Peterson   Note: This dictation was prepared with Dragon dictation. Any transcriptional errors that result from this process are unintentional    Kelly Peterson Kelly Peterson, Kelly Peterson 03/14/16 1438

## 2016-03-14 NOTE — ED Triage Notes (Signed)
\  [id number]\

## 2016-04-18 ENCOUNTER — Encounter: Payer: Self-pay | Admitting: Emergency Medicine

## 2016-04-18 ENCOUNTER — Emergency Department
Admission: EM | Admit: 2016-04-18 | Discharge: 2016-04-18 | Disposition: A | Discharge status: Home / Self Care | Payer: Medicaid Other | Attending: Emergency Medicine | Admitting: Emergency Medicine

## 2016-04-18 DIAGNOSIS — K5901 Slow transit constipation: Secondary | ICD-10-CM

## 2016-04-18 DIAGNOSIS — K5641 Fecal impaction: Secondary | ICD-10-CM | POA: Insufficient documentation

## 2016-04-18 DIAGNOSIS — Z79899 Other long term (current) drug therapy: Secondary | ICD-10-CM | POA: Diagnosis not present

## 2016-04-18 DIAGNOSIS — K5900 Constipation, unspecified: Secondary | ICD-10-CM | POA: Diagnosis present

## 2016-04-18 MED ORDER — SENNOSIDES-DOCUSATE SODIUM 8.6-50 MG PO TABS: 1.0000 | ORAL_TABLET | Freq: Every day | ORAL | 0 refills | Status: DC

## 2016-04-18 MED ORDER — HYDROCORTISONE 1 % RE CREA: 1.0000 "application " | TOPICAL_CREAM | Freq: Three times a day (TID) | RECTAL | 0 refills | Status: DC

## 2016-04-18 MED ORDER — FLEET ENEMA 7-19 GM/118ML RE ENEM
1.0000 | ENEMA | Freq: Once | RECTAL | Status: AC
  Administered 2016-04-18: 1 via RECTAL

## 2016-04-18 NOTE — ED Provider Notes (Signed)
[facility] Emergency Department Provider Note   ____________________________________________   First MD Initiated Contact with Patient 04/18/16 [phone]     (approximate)  I have reviewed the triage vital signs and the nursing notes.   HISTORY  Chief Complaint Constipation    HPI Kelly Peterson is a 35 y.o. female patient complaining of no bowel movement for 4 days. Patient states she's tried over-the-counter laxatives without any relief. Patient denies any use of narcotic medications in the past 2 weeks. Patient state pain is increased as she developed vomiting this morning. Patient rates the pain as a 8/10. Patient described the pain as "sharp".   Past Medical History:  Diagnosis Date  . Endometriosis   . GERD (gastroesophageal reflux disease)    NO MEDS  . Mood disorder (HCC) unk  . Stomach ulcer     Patient Active Problem List   Diagnosis Date Noted  . Intractable pain 02/08/2016  . Mood disorder (HCC) 02/08/2016    Past Surgical History:  Procedure Laterality Date  . DILATION AND CURETTAGE OF UTERUS    . ESOPHAGOGASTRODUODENOSCOPY (EGD) WITH PROPOFOL N/A 01/23/2015   Procedure: ESOPHAGOGASTRODUODENOSCOPY (EGD) WITH PROPOFOL;  Surgeon: Christena DeemMartin U Skulskie, MD;  [facility];  Service: Endoscopy;  Laterality: N/A;    Prior to Admission medications   Medication Sig Start Date End Date Taking? Authorizing Provider  citalopram (CELEXA) 40 MG tablet Take 40 mg by mouth daily.   Yes Historical Provider, MD  diazepam (VALIUM) 5 MG tablet Take 1 tablet (5 mg total) by mouth every 8 (eight) hours as needed for muscle spasms. 03/08/16   Charlynne Panderavid Hsienta Yao, MD  gabapentin (NEURONTIN) 300 MG capsule Take 300 mg by mouth 3 (three) times daily.     Historical Provider, MD  hydrocortisone (PROCTOCORT) 1 % CREA Apply 1 application topically 3 (three) times daily. 04/18/16   Joni Reiningonald K Smith, PA-C  oxyCODONE (OXY IR/ROXICODONE) 5 MG immediate  release tablet Take 1 tablet (5 mg total) by mouth every 6 (six) hours as needed for moderate pain. Patient not taking: Reported on 03/14/2016 02/09/16   Auburn BilberryShreyang Patel, MD  oxyCODONE-acetaminophen (PERCOCET) 5-325 MG tablet Take 1 tablet by mouth every 6 (six) hours as needed. Patient not taking: Reported on 03/14/2016 03/08/16   Charlynne Panderavid Hsienta Yao, MD  oxyCODONE-acetaminophen (PERCOCET) 5-325 MG tablet Take 2 tablets by mouth every 6 (six) hours as needed for moderate pain or severe pain. 03/14/16   Emily FilbertJonathan E Williams, MD  senna-docusate (PERI-COLACE) 8.6-50 MG tablet Take 1 tablet by mouth daily. 04/18/16   Joni Reiningonald K Smith, PA-C  tiZANidine (ZANAFLEX) 2 MG tablet Take 1 tablet by mouth 3 (three) times daily as needed. 02/11/16   Historical Provider, MD  traZODone (DESYREL) 50 MG tablet Take 50 mg by mouth at bedtime.    Historical Provider, MD    Allergies Morphine and related  Family History  Problem Relation Age of Onset  . Breast cancer Mother   . Bipolar disorder Father     Social History Social History  Substance Use Topics  . Smoking status: Never Smoker  . Smokeless tobacco: Never Used  . Alcohol use No    Review of Systems Constitutional: No fever/chills Eyes: No visual changes. ENT: No sore throat. Cardiovascular: Denies chest pain. Respiratory: Denies shortness of breath. Gastrointestinal: No abdominal pain.  No nausea, no vomiting.  No diarrhea.  Constipation for 3 days. Genitourinary: Negative for dysuria. Musculoskeletal: Negative for back pain. Skin: Negative for rash.  Neurological: Negative for headaches, focal weakness or numbness.    ____________________________________________   PHYSICAL EXAM:  VITAL SIGNS: ED Triage Vitals  Enc Vitals Group     BP 04/18/16 0847 133/83     Pulse Rate 04/18/16 0847 97     Resp 04/18/16 0847 18     Temp 04/18/16 0847 98.2 F (36.8 C)     Temp Source 04/18/16 0847 Oral     SpO2 04/18/16 0847 100 %     Weight  04/18/16 0847 125 lb (56.7 kg)     Height 04/18/16 0847 5\' 3"  (1.6 m)     Head Circumference --      Peak Flow --      Pain Score 04/18/16 0858 8     Pain Loc --      Pain Edu? --      Excl. in GC? --     Constitutional: Alert and oriented. Well appearing and in no acute distress. Eyes: Conjunctivae are normal. PERRL. EOMI. Head: Atraumatic. Nose: No congestion/rhinnorhea. Mouth/Throat: Mucous membranes are moist.  Oropharynx non-erythematous. Neck: No stridor.  No cervical spine tenderness to palpation. Hematological/Lymphatic/Immunilogical: No cervical lymphadenopathy. Cardiovascular: Normal rate, regular rhythm. Grossly normal heart sounds.  Good peripheral circulation. Respiratory: Normal respiratory effort.  No retractions. Lungs CTAB. Gastrointestinal:Decreased bowel sounds Soft and nontender.  No distention. No abdominal bruits. No CVA tenderness. Rectal impaction on chaperoned digital exam. Musculoskeletal: No lower extremity tenderness nor edema.  No joint effusions. Neurologic:  Normal speech and language. No gross focal neurologic deficits are appreciated. No gait instability. Skin:  Skin is warm, dry and intact. No rash noted. Psychiatric: Mood and affect are normal. Speech and behavior are normal.  ____________________________________________   LABS (all labs ordered are listed, but only abnormal results are displayed)  Labs Reviewed - No data to display ____________________________________________  EKG   ____________________________________________  RADIOLOGY   ____________________________________________   PROCEDURES  Procedure(s) performed: None  Procedures  Critical Care performed: No  ____________________________________________   INITIAL IMPRESSION / ASSESSMENT AND PLAN / ED COURSE  Pertinent labs & imaging results that were available during my care of the patient were reviewed by me and considered in my medical decision making (see chart  for details).  Constipation. Patient given discharge care instructions. Patient given prescription for Peri-Colace.  Clinical Course    Status post Fleet enema patient discharged large stool.  ____________________________________________   FINAL CLINICAL IMPRESSION(S) / ED DIAGNOSES  Final diagnoses:  Constipation by delayed colonic transit  Fecal impaction in rectum (HCC)      NEW MEDICATIONS STARTED DURING THIS VISIT:  New Prescriptions   HYDROCORTISONE (PROCTOCORT) 1 % CREA    Apply 1 application topically 3 (three) times daily.   SENNA-DOCUSATE (PERI-COLACE) 8.6-50 MG TABLET    Take 1 tablet by mouth daily.     Note:  This document was prepared using Dragon voice recognition software and may include unintentional dictation errors.    Joni Reiningonald K Smith, PA-C 04/18/16 1056    Rockne MenghiniAnne-Caroline Norman, MD 04/18/16 [phone]

## 2016-04-18 NOTE — ED Triage Notes (Signed)
Pt to ed with c/o constipation for 4 days.  Pt states she has tried OTC meds  without relief.

## 2016-04-18 NOTE — ED Notes (Signed)
See triage note   States she is constipate  Last BM was 4 days ago ..has tried OTC meds w/o relief   Developed some vomiting this am  Pt is very tearful  And moving all over the stretcher

## 2016-08-24 ENCOUNTER — Emergency Department: Payer: No Typology Code available for payment source

## 2016-08-24 ENCOUNTER — Encounter: Payer: Self-pay | Admitting: Emergency Medicine

## 2016-08-24 ENCOUNTER — Emergency Department
Admission: EM | Admit: 2016-08-24 | Discharge: 2016-08-24 | Disposition: A | Discharge status: Home / Self Care | Payer: No Typology Code available for payment source | Attending: Emergency Medicine | Admitting: Emergency Medicine

## 2016-08-24 DIAGNOSIS — Y92410 Unspecified street and highway as the place of occurrence of the external cause: Secondary | ICD-10-CM | POA: Diagnosis not present

## 2016-08-24 DIAGNOSIS — M25512 Pain in left shoulder: Secondary | ICD-10-CM | POA: Diagnosis not present

## 2016-08-24 DIAGNOSIS — S199XXA Unspecified injury of neck, initial encounter: Secondary | ICD-10-CM | POA: Diagnosis present

## 2016-08-24 DIAGNOSIS — Y999 Unspecified external cause status: Secondary | ICD-10-CM | POA: Diagnosis not present

## 2016-08-24 DIAGNOSIS — Y9389 Activity, other specified: Secondary | ICD-10-CM | POA: Insufficient documentation

## 2016-08-24 DIAGNOSIS — Z79899 Other long term (current) drug therapy: Secondary | ICD-10-CM | POA: Insufficient documentation

## 2016-08-24 DIAGNOSIS — S161XXA Strain of muscle, fascia and tendon at neck level, initial encounter: Secondary | ICD-10-CM | POA: Diagnosis not present

## 2016-08-24 MED ORDER — KETOROLAC TROMETHAMINE 30 MG/ML IJ SOLN
30.0000 mg | Freq: Once | INTRAMUSCULAR | Status: AC
  Administered 2016-08-24: 30 mg via INTRAMUSCULAR
  Filled 2016-08-24: qty 1

## 2016-08-24 MED ORDER — ONDANSETRON 8 MG PO TBDP
8.0000 mg | ORAL_TABLET | Freq: Once | ORAL | Status: AC
  Administered 2016-08-24: 8 mg via ORAL

## 2016-08-24 MED ORDER — ORPHENADRINE CITRATE 30 MG/ML IJ SOLN
60.0000 mg | Freq: Once | INTRAMUSCULAR | Status: AC
  Administered 2016-08-24: 60 mg via INTRAMUSCULAR
  Filled 2016-08-24: qty 2

## 2016-08-24 MED ORDER — ONDANSETRON 8 MG PO TBDP
ORAL_TABLET | ORAL | Status: AC
  Filled 2016-08-24: qty 1

## 2016-08-24 MED ORDER — HYDROCODONE-ACETAMINOPHEN 5-325 MG PO TABS
1.0000 | ORAL_TABLET | Freq: Once | ORAL | Status: AC
  Administered 2016-08-24: 1 via ORAL
  Filled 2016-08-24: qty 1

## 2016-08-24 MED ORDER — MELOXICAM 15 MG PO TABS: 15.0000 mg | ORAL_TABLET | Freq: Every day | ORAL | 0 refills | Status: DC

## 2016-08-24 MED ORDER — METHOCARBAMOL 500 MG PO TABS: 500.0000 mg | ORAL_TABLET | Freq: Four times a day (QID) | ORAL | 0 refills | Status: DC
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
## 2016-10-23 ENCOUNTER — Emergency Department
Admission: EM | Admit: 2016-10-23 | Discharge: 2016-10-23 | Disposition: A | Discharge status: Home / Self Care | Payer: Medicaid Other | Attending: Emergency Medicine | Admitting: Emergency Medicine

## 2016-10-23 ENCOUNTER — Encounter: Payer: Self-pay | Admitting: *Deleted

## 2016-10-23 DIAGNOSIS — K1379 Other lesions of oral mucosa: Secondary | ICD-10-CM | POA: Insufficient documentation

## 2016-10-23 DIAGNOSIS — K120 Recurrent oral aphthae: Secondary | ICD-10-CM

## 2016-10-23 DIAGNOSIS — Z79899 Other long term (current) drug therapy: Secondary | ICD-10-CM | POA: Insufficient documentation

## 2016-10-23 NOTE — ED Notes (Signed)

## 2016-10-23 NOTE — ED Notes (Signed)
Pt c/o ulcer under her tongue that started this morning. Pt states by lunch she could not take the pain anymore. Pt is alert and oriented. Pt does appear to have an open wound under tongue in the center. No bleeding noted at this time.

## 2016-10-23 NOTE — ED Provider Notes (Signed)
[facility] Emergency Department Provider Note   ____________________________________________   I have reviewed the triage vital signs and the nursing notes.   HISTORY  Chief Complaint Dental Pain    HPI Kelly Peterson is a 36 y.o. female    Patient denies fever, chills, headache, vision changes, chest pain, chest tightness, shortness of breath, abdominal pain, nausea and vomiting.  Past Medical History:  Diagnosis Date  . Endometriosis   . GERD (gastroesophageal reflux disease)    NO MEDS  . Mood disorder (HCC) unk  . Stomach ulcer     Patient Active Problem List   Diagnosis Date Noted  . Intractable pain 02/08/2016  . Mood disorder (HCC) 02/08/2016    Past Surgical History:  Procedure Laterality Date  . DILATION AND CURETTAGE OF UTERUS    . ESOPHAGOGASTRODUODENOSCOPY (EGD) WITH PROPOFOL N/A 01/23/2015   Procedure: ESOPHAGOGASTRODUODENOSCOPY (EGD) WITH PROPOFOL;  Surgeon: Christena DeemMartin U Skulskie, MD;  [facility] ENDOSCOPY;  Service: Endoscopy;  Laterality: N/A;    Prior to Admission medications   Medication Sig Start Date End Date Taking? Authorizing Provider  citalopram (CELEXA) 40 MG tablet Take 40 mg by mouth daily.    [provider]  diazepam (VALIUM) 5 MG tablet Take 1 tablet (5 mg total) by mouth every 8 (eight) hours as needed for muscle spasms. 03/08/16   Charlynne PanderYao, David Hsienta, MD  gabapentin (NEURONTIN) 300 MG capsule Take 300 mg by mouth 3 (three) times daily.     [provider]  hydrocortisone (PROCTOCORT) 1 % CREA Apply 1 application topically 3 (three) times daily. 04/18/16   Joni ReiningSmith, Ronald K, PA-C  meloxicam (MOBIC) 15 MG tablet Take 1 tablet (15 mg total) by mouth daily. 08/24/16   Cuthriell, Delorise RoyalsJonathan D, PA-C  methocarbamol (ROBAXIN) 500 MG tablet Take 1 tablet (500 mg total) by mouth 4 (four) times daily. 08/24/16   Cuthriell, Delorise RoyalsJonathan D, PA-C  tiZANidine (ZANAFLEX) 2 MG tablet Take 1 tablet by mouth 3 (three)  times daily as needed. 02/11/16   [provider]  traZODone (DESYREL) 50 MG tablet Take 50 mg by mouth at bedtime.    [provider]    Allergies Morphine and related  Family History  Problem Relation Age of Onset  . Breast cancer Mother   . Bipolar disorder Father     Social History Social History  Substance Use Topics  . Smoking status: Never Smoker  . Smokeless tobacco: Never Used  . Alcohol use No    Review of Systems Constitutional: Negative for fever/chills Eyes: No visual changes. ENT:  Negative for sore throat. Painful swallowing secondary to sublingual ulcer. Cardiovascular: Denies chest pain. Respiratory: Denies cough Denies shortness of breath. Skin: Negative for rash. Neurological: Negative for headaches. ____________________________________________   PHYSICAL EXAM:  VITAL SIGNS: ED Triage Vitals  Enc Vitals Group     BP 10/23/16 1410 124/78     Pulse Rate 10/23/16 1410 98     Resp 10/23/16 1410 18     Temp 10/23/16 1410 98.4 F (36.9 C)     Temp Source 10/23/16 1410 Oral     SpO2 10/23/16 1410 100 %     Weight 10/23/16 1410 120 lb (54.4 kg)     Height 10/23/16 1410 5\' 4"  (1.626 m)     Head Circumference --      Peak Flow --      Pain Score 10/23/16 1409 10     Pain Loc --  Pain Edu? --      Excl. in GC? --     Constitutional: Alert and oriented. Well appearing and in no acute distress.  Head: Normocephalic and atraumatic. Eyes: Conjunctivae are normal. Mouth/Throat: Mucous membranes are moist. Oropharynx clear. Sublingual ulcer/canker sore.  Neck: Supple Hematological/Lymphatic/Immunological: No cervical lymphadenopathy. Cardiovascular: Normal rate, regular rhythm. Normal distal pulses. Respiratory: Normal respiratory effort.  Musculoskeletal: Nontender with normal range of motion in all extremities. Neurologic: Normal speech and language.  Skin:  Skin is warm, dry and intact. No rash noted. Psychiatric: Mood and  affect are normal.  ____________________________________________   LABS (all labs ordered are listed, but only abnormal results are displayed)  Labs Reviewed - No data to display ____________________________________________  EKG None  ____________________________________________  RADIOLOGY None ____________________________________________   PROCEDURES  Procedure(s) performed: no    Critical Care performed: no ____________________________________________   INITIAL IMPRESSION / ASSESSMENT AND PLAN / ED COURSE  Pertinent labs & imaging results that were available during my care of the patient were reviewed by me and considered in my medical decision making (see chart for details).  Patient presents with sublingual canker ulcer sore that developed today. Physical exam and vital signs are assuring. Patient advised to manage with warm saline gargle as tolerated and OTC Orajel. Patient informed of clinical course, understand medical decision-making process, and agree with plan.  Patient was advised to follow up with PCP as needed and was also advised to return to the emergency department for symptoms that change or worsen.      ____________________________________________   FINAL CLINICAL IMPRESSION(S) / ED DIAGNOSES  Final diagnoses:  Canker sores oral       NEW MEDICATIONS STARTED DURING THIS VISIT:  Discharge Medication List as of 10/23/2016  4:10 PM       Note:  This document was prepared using Dragon voice recognition software and may include unintentional dictation errors.   Clois Comber, PA-C 10/23/16 1926    Merrily Brittle, MD 10/24/16 [phone]

## 2016-10-23 NOTE — ED Triage Notes (Signed)
States she noticed an ulcer under her tongue and states increased pain over the morning, pt in no distress

## 2016-10-23 NOTE — Discharge Instructions (Signed)
Use Orajel as needed and gargle with warm salt water as tolerated.

## 2017-07-15 ENCOUNTER — Other Ambulatory Visit: Payer: Self-pay

## 2017-07-15 ENCOUNTER — Emergency Department
Admission: EM | Admit: 2017-07-15 | Discharge: 2017-07-15 | Disposition: A | Discharge status: Home / Self Care | Payer: Self-pay | Attending: Emergency Medicine | Admitting: Emergency Medicine

## 2017-07-15 ENCOUNTER — Encounter: Payer: Self-pay | Admitting: Emergency Medicine

## 2017-07-15 DIAGNOSIS — H6122 Impacted cerumen, left ear: Secondary | ICD-10-CM | POA: Insufficient documentation

## 2017-07-15 DIAGNOSIS — H6121 Impacted cerumen, right ear: Secondary | ICD-10-CM | POA: Insufficient documentation

## 2017-07-15 DIAGNOSIS — Z79899 Other long term (current) drug therapy: Secondary | ICD-10-CM | POA: Insufficient documentation

## 2017-07-15 DIAGNOSIS — H6123 Impacted cerumen, bilateral: Secondary | ICD-10-CM

## 2017-07-15 MED ORDER — NAPROXEN 500 MG PO TABS: 500.0000 mg | ORAL_TABLET | Freq: Two times a day (BID) | ORAL | Status: DC

## 2017-07-15 MED ORDER — CARBAMIDE PEROXIDE 6.5 % OT SOLN: 5.0000 [drp] | Freq: Two times a day (BID) | OTIC | 2 refills | Status: DC

## 2017-07-15 NOTE — ED Notes (Signed)
Pt presents with c/o L ear pain. Pt states this morning woke up, pain was radiating down the L side of her neck with swelling. Pt also c/o her ear being "stopped up" for 5 days.

## 2017-07-15 NOTE — ED Notes (Signed)
NAD noted at time of D/C. Pt denies questions or concerns. Pt ambulatory to the lobby at this time.  

## 2017-07-15 NOTE — ED Triage Notes (Signed)
Cant hear out of L ear x 1 week. Now painful including under jaw.

## 2017-07-15 NOTE — Discharge Instructions (Signed)
Use eardrops as directed for at least 3 days before considering irrigation.

## 2017-07-15 NOTE — ED Provider Notes (Signed)
[facility] Emergency Department Provider Note   ____________________________________________   First MD Initiated Contact with Patient 07/15/17 1000     (approximate)  I have reviewed the triage vital signs and the nursing notes.   HISTORY  Chief Complaint Otalgia    HPI Kelly Peterson is a 37 y.o. female patient complained of decreased hearing left ear for 1 week.  Patient state yesterday she used hydrogen peroxide full-strength to dislodge wax in it.  Patient state she awakened this morning with increased pain in the ear canal.  Patient rates the pain as a 4/10.  Patient described the pain is "achy".  No other palliative measures for complaint.   Past Medical History:  Diagnosis Date  . Endometriosis   . GERD (gastroesophageal reflux disease)    NO MEDS  . Mood disorder (HCC) unk  . Stomach ulcer     Patient Active Problem List   Diagnosis Date Noted  . Intractable pain 02/08/2016  . Mood disorder (HCC) 02/08/2016    Past Surgical History:  Procedure Laterality Date  . DILATION AND CURETTAGE OF UTERUS    . ESOPHAGOGASTRODUODENOSCOPY (EGD) WITH PROPOFOL N/A 01/23/2015   Procedure: ESOPHAGOGASTRODUODENOSCOPY (EGD) WITH PROPOFOL;  Surgeon: Christena Deem, MD;  Location: [facility] ENDOSCOPY;  Service: Endoscopy;  Laterality: N/A;    Prior to Admission medications   Medication Sig Start Date End Date Taking? Authorizing Provider  carbamide peroxide (DEBROX) 6.5 % OTIC solution Place 5 drops into the right ear 2 (two) times daily. 07/15/17 07/15/18  Joni Reining, PA-C  citalopram (CELEXA) 40 MG tablet Take 40 mg by mouth daily.    [provider]  diazepam (VALIUM) 5 MG tablet Take 1 tablet (5 mg total) by mouth every 8 (eight) hours as needed for muscle spasms. 03/08/16   Charlynne Pander, MD  gabapentin (NEURONTIN) 300 MG capsule Take 300 mg by mouth 3 (three) times daily.     [provider]  hydrocortisone (PROCTOCORT) 1 %  CREA Apply 1 application topically 3 (three) times daily. 04/18/16   Joni Reining, PA-C  meloxicam (MOBIC) 15 MG tablet Take 1 tablet (15 mg total) by mouth daily. 08/24/16   Cuthriell, Delorise Royals, PA-C  methocarbamol (ROBAXIN) 500 MG tablet Take 1 tablet (500 mg total) by mouth 4 (four) times daily. 08/24/16   Cuthriell, Delorise Royals, PA-C  naproxen (NAPROSYN) 500 MG tablet Take 1 tablet (500 mg total) by mouth 2 (two) times daily with a meal. 07/15/17   Joni Reining, PA-C  tiZANidine (ZANAFLEX) 2 MG tablet Take 1 tablet by mouth 3 (three) times daily as needed. 02/11/16   [provider]  traZODone (DESYREL) 50 MG tablet Take 50 mg by mouth at bedtime.    [provider]    Allergies Morphine and related  Family History  Problem Relation Age of Onset  . Breast cancer Mother   . Bipolar disorder Father     Social History Social History   Tobacco Use  . Smoking status: Never Smoker  . Smokeless tobacco: Never Used  Substance Use Topics  . Alcohol use: No  . Drug use: No    Review of Systems Constitutional: No fever/chills Eyes: No visual changes. ENT: No sore throat. Cardiovascular: Denies chest pain. Respiratory: Denies shortness of breath. Gastrointestinal: No abdominal pain.  No nausea, no vomiting.  No diarrhea.  No constipation. Genitourinary: Negative for dysuria. Musculoskeletal: Negative for back pain. Skin: Negative for rash. Neurological: Negative for  headaches, focal weakness or numbness. Psychiatric:Mood disorder Allergic/Immunilogical: Morphine and related products  ____________________________________________   PHYSICAL EXAM:  VITAL SIGNS: ED Triage Vitals  Enc Vitals Group     BP 07/15/17 0947 (!) 144/87     Pulse Rate 07/15/17 0947 84     Resp 07/15/17 0947 20     Temp 07/15/17 0947 98.2 F (36.8 C)     Temp Source 07/15/17 0947 Oral     SpO2 07/15/17 0947 100 %     Weight 07/15/17 0948 120 lb (54.4 kg)     Height 07/15/17  0948 5\' 3"  (1.6 m)     Head Circumference --      Peak Flow --      Pain Score 07/15/17 0948 4     Pain Loc --      Pain Edu? --      Excl. in GC? --    Constitutional: Alert and oriented. Well appearing and in no acute distress. Nose: No congestion/rhinnorhea. Mouth/Throat: Mucous membranes are moist.  Oropharynx non-erythematous. EARS: Bilateral TMs not visible secondary to impaction.  Left canal is erythematous. Neck: No stridor.   Cardiovascular: Normal rate, regular rhythm. Grossly normal heart sounds.  Good peripheral circulation. Respiratory: Normal respiratory effort.  No retractions. Lungs CTAB. Neurologic:  Normal speech and language. No gross focal neurologic deficits are appreciated. No gait instability. Skin:  Skin is warm, dry and intact. No rash noted. Psychiatric: Mood and affect are normal. Speech and behavior are normal.  ____________________________________________   LABS (all labs ordered are listed, but only abnormal results are displayed)  Labs Reviewed - No data to display ____________________________________________  EKG   ____________________________________________  RADIOLOGY  ED MD interpretation:    Official radiology report(s): No results found.  ____________________________________________   PROCEDURES  Procedure(s) performed:   Procedures  Critical Care performed:   ____________________________________________   INITIAL IMPRESSION / ASSESSMENT AND PLAN / ED COURSE  As part of my medical decision making, I reviewed the following data within the electronic MEDICAL RECORD NUMBER    Bilateral cerumen impaction.  Patient advised to future did not use full strength hydrogen peroxide to loosen the impaction.  Patient given a prescription for Debrox and discharge care instruction.  Patient advised to follow-up with 3-5 days with PCP to consider ear irrigation.      ____________________________________________   FINAL CLINICAL  IMPRESSION(S) / ED DIAGNOSES  Final diagnoses:  Bilateral impacted cerumen     ED Discharge Orders        Ordered    carbamide peroxide (DEBROX) 6.5 % OTIC solution  2 times daily     07/15/17 1006    naproxen (NAPROSYN) 500 MG tablet  2 times daily with meals     07/15/17 1007       Note:  This document was prepared using Dragon voice recognition software and may include unintentional dictation errors.    Joni ReiningSmith, Larrisha Babineau K, PA-C 07/15/17 1014    Governor RooksLord, Rebecca, MD 07/15/17 [phone]

## 2017-09-23 IMAGING — CT CT ABD-PELV W/ CM
2 of 4 series · 15 of 46 positions shown, 17 images · IV contrast (iopamidol)
Comparison: April 12, 2012

CLINICAL DATA: Right lower quadrant abdomen pain after pelvic exam
today.

EXAM:
CT ABDOMEN AND PELVIS WITH CONTRAST
TECHNIQUE: Multidetector CT imaging of the abdomen and pelvis was performed
using the standard protocol following bolus administration of
intravenous contrast.
CONTRAST:  100mL [id number]-EOO IOPAMIDOL ([id number]-EOO) INJECTION 61%

[Series 2: axial st · axial · 0.67mm/px · z∈[-808,-383]mm · 12 of 93 slices shown, 14 images]
[im 4/93  soft-tissue]
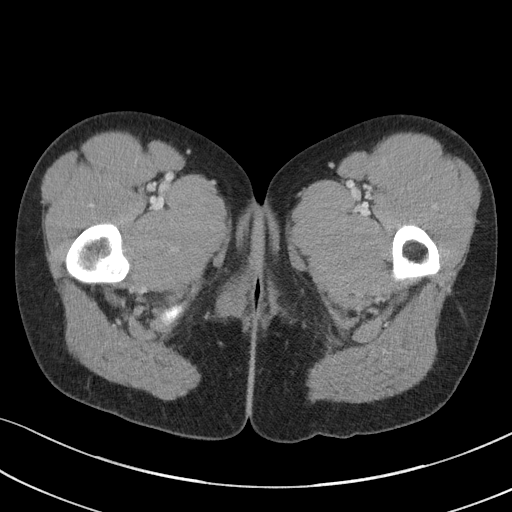
[im 4/93  bone]
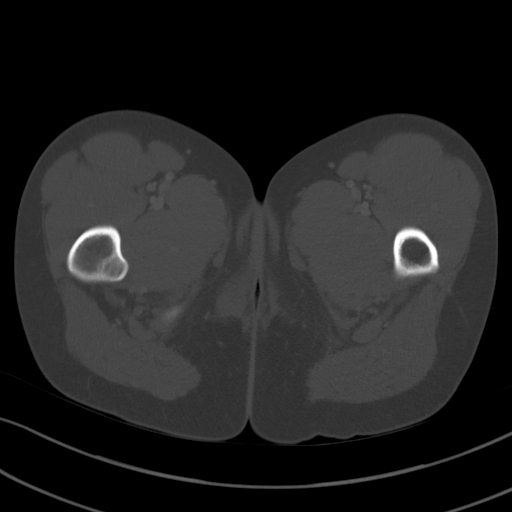
[im 12/93  soft-tissue]
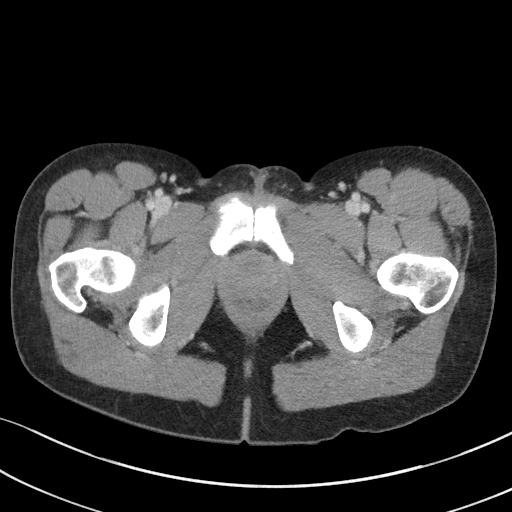
[im 20/93  soft-tissue]
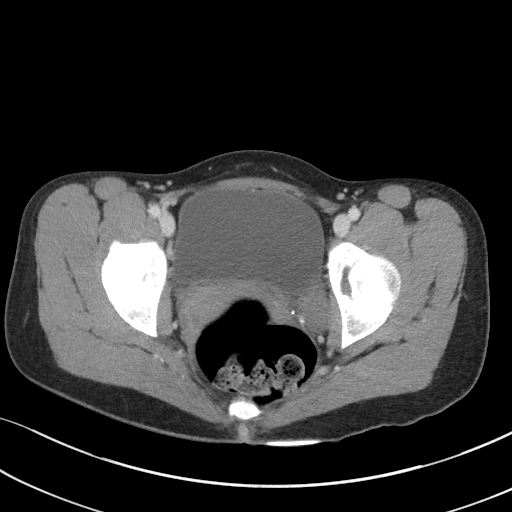
[im 27/93  soft-tissue]
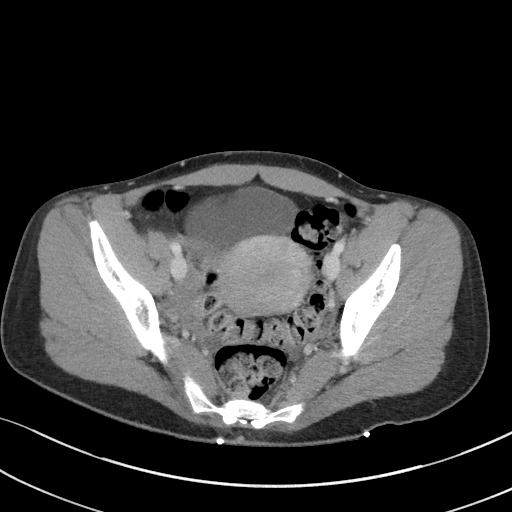
[im 35/93  soft-tissue]
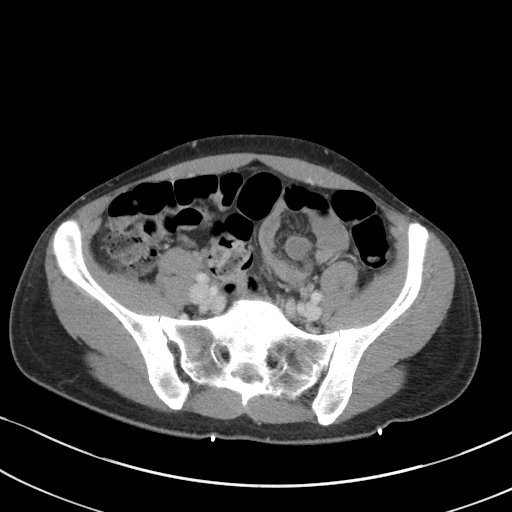
[im 43/93  soft-tissue]
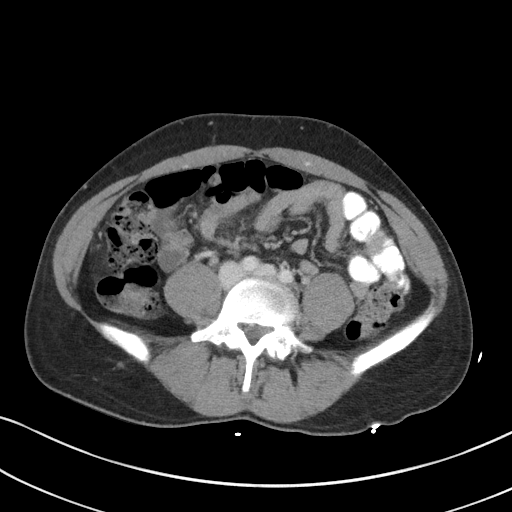
[im 50/93  soft-tissue]
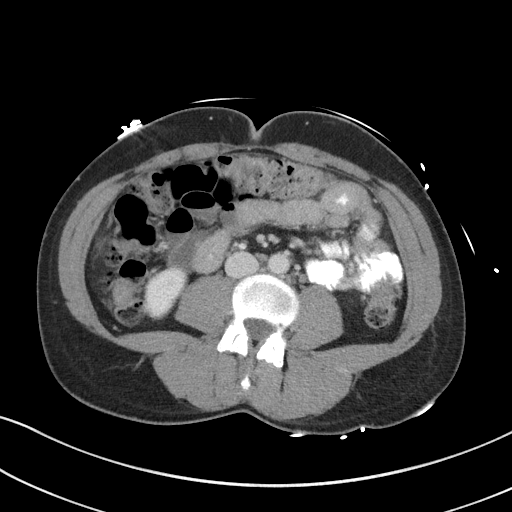
[im 58/93  soft-tissue]
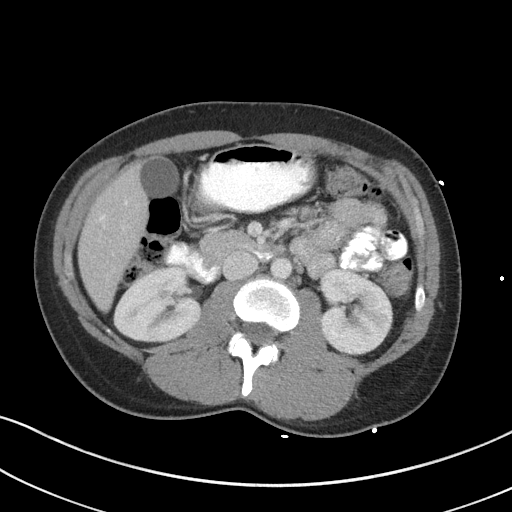
[im 66/93  soft-tissue]
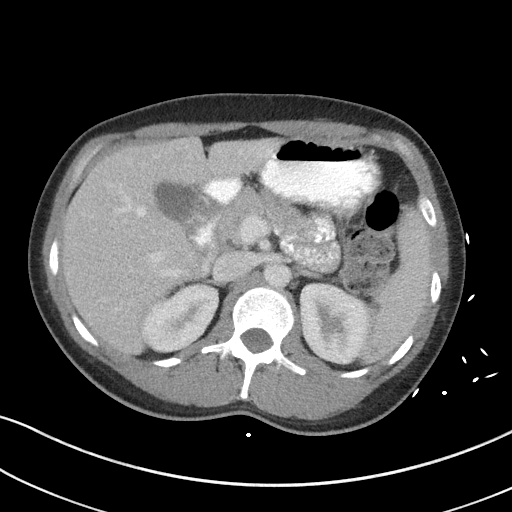
[im 66/93  bone]
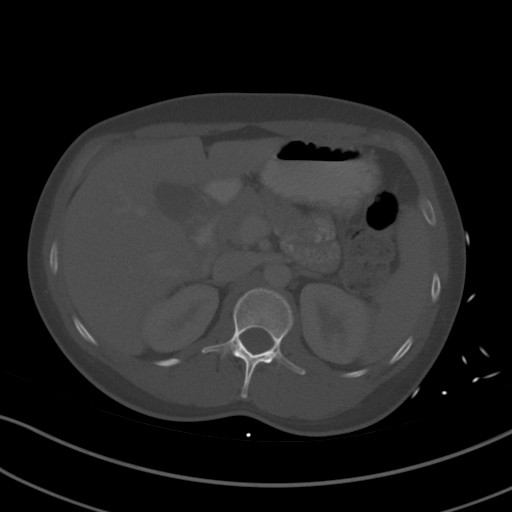
[im 73/93  soft-tissue]
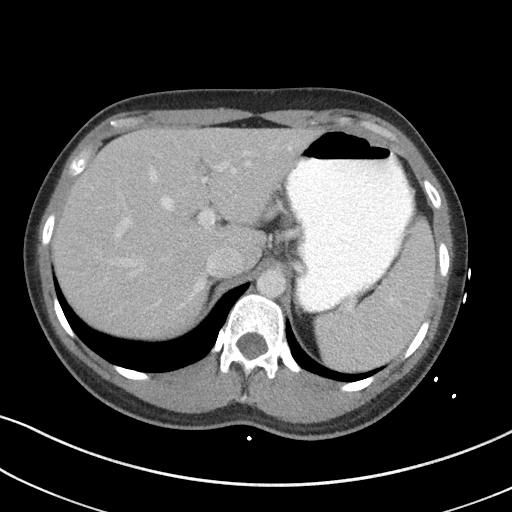
[im 81/93  soft-tissue]
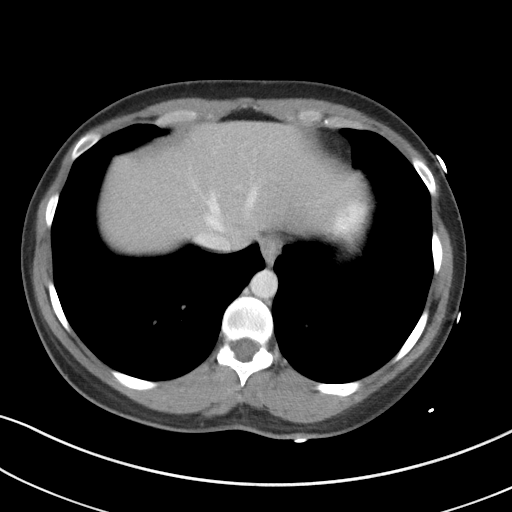
[im 89/93  soft-tissue]
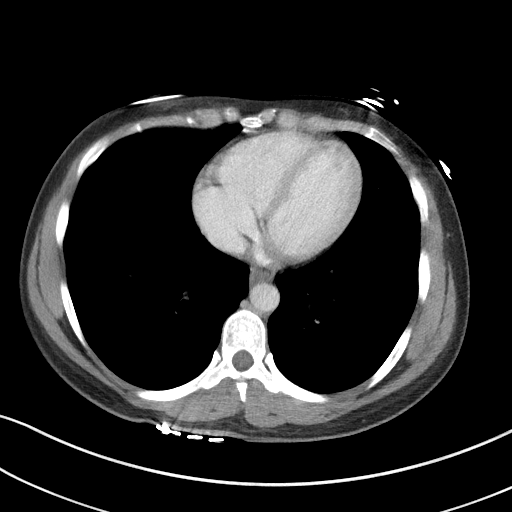

[Series 5: coronal st · coronal · 0.63mm/px · 3 of 82 slices shown]
[im 28/82  soft-tissue]
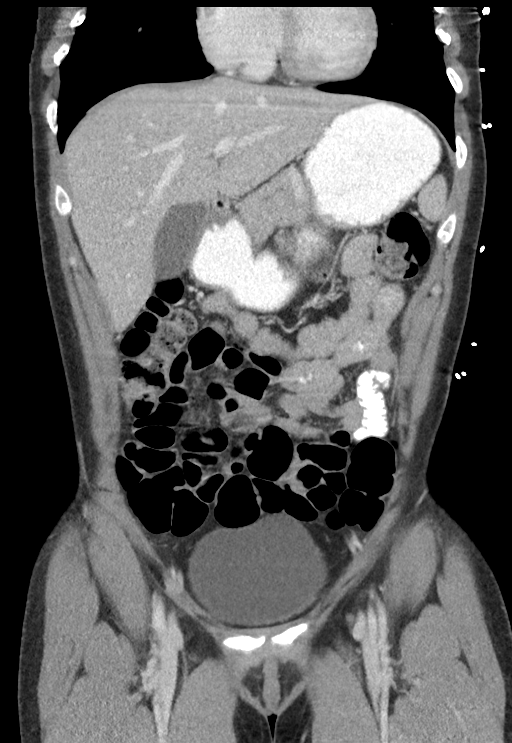
[im 37/82  soft-tissue]
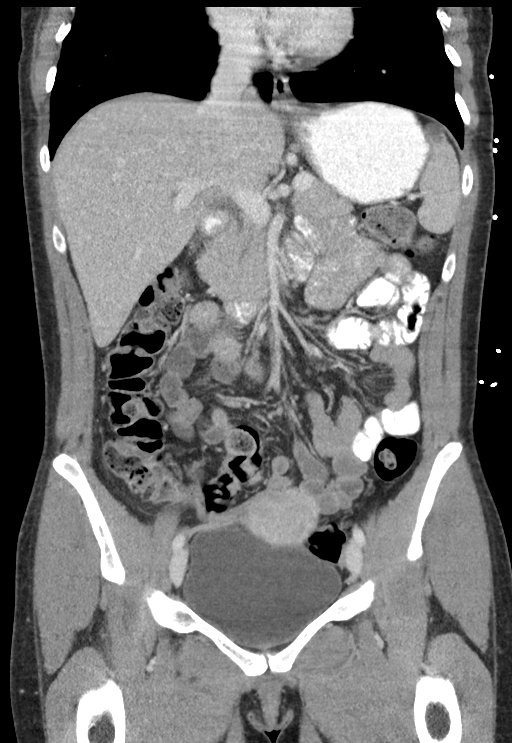
[im 46/82  soft-tissue]
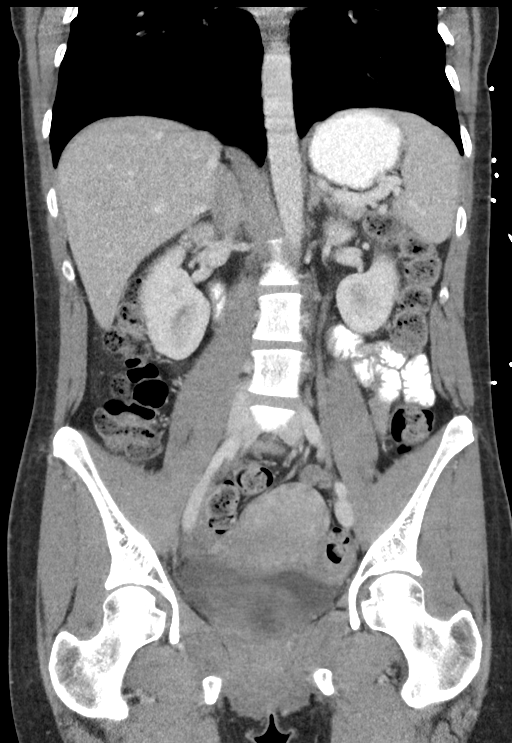

[15 of 46 positions shown; findings below may reference images not displayed]

FINDINGS: Lower chest: No acute abnormality.

Hepatobiliary: No focal liver abnormality is seen. No gallstones,
gallbladder wall thickening, or biliary dilatation.

Pancreas: Unremarkable. No pancreatic ductal dilatation or
surrounding inflammatory changes.

Spleen: Normal in size without focal abnormality.

Adrenals/Urinary Tract: Adrenal glands are unremarkable. The right
kidney is normal, without renal calculi, focal lesion, or
hydronephrosis. There is an 8 mm simple cyst in upper pole left
kidney. There is no left hydronephrosis. Bladder is unremarkable.

Stomach/Bowel: Stomach is within normal limits. The appendix is not
seen but no inflammatory changes noted around the cecum. No evidence
of bowel wall thickening, distention, or inflammatory changes.
Extensive bowel content is identified throughout colon.

Vascular/Lymphatic: No significant vascular findings are present. No
enlarged abdominal or pelvic lymph nodes.

Reproductive: There is heterogeneous enhancement of the uterine wall
probably due to myomatous uterus. The overall size and shape of the
uterus is unchanged compared to prior noncontrast CT.

Other: There is small umbilical herniation of mesenteric fat. There
is no free air.

Musculoskeletal: No acute or significant osseous findings. There is
a stable small bone island in the left iliac wing unchanged.
IMPRESSION: No acute abnormality identified in the abdomen and pelvis.

No bowel obstruction. Constipation. The appendix is not seen but no
inflammation is noted around cecum.

## 2017-10-08 ENCOUNTER — Encounter: Payer: Self-pay | Admitting: Emergency Medicine

## 2017-10-08 ENCOUNTER — Emergency Department
Admission: EM | Admit: 2017-10-08 | Discharge: 2017-10-09 | Disposition: A | Discharge status: Other Acute Inpatient Hospital | Payer: Self-pay | Attending: Emergency Medicine | Admitting: Emergency Medicine

## 2017-10-08 DIAGNOSIS — F23 Brief psychotic disorder: Secondary | ICD-10-CM | POA: Insufficient documentation

## 2017-10-08 DIAGNOSIS — F3110 Bipolar disorder, current episode manic without psychotic features, unspecified: Secondary | ICD-10-CM | POA: Insufficient documentation

## 2017-10-08 DIAGNOSIS — F1310 Sedative, hypnotic or anxiolytic abuse, uncomplicated: Secondary | ICD-10-CM

## 2017-10-08 DIAGNOSIS — F312 Bipolar disorder, current episode manic severe with psychotic features: Secondary | ICD-10-CM

## 2017-10-08 DIAGNOSIS — Z79899 Other long term (current) drug therapy: Secondary | ICD-10-CM | POA: Insufficient documentation

## 2017-10-08 DIAGNOSIS — F1520 Other stimulant dependence, uncomplicated: Secondary | ICD-10-CM

## 2017-10-08 DIAGNOSIS — F39 Unspecified mood [affective] disorder: Secondary | ICD-10-CM | POA: Insufficient documentation

## 2017-10-08 DIAGNOSIS — F1020 Alcohol dependence, uncomplicated: Secondary | ICD-10-CM

## 2017-10-08 DIAGNOSIS — F29 Unspecified psychosis not due to a substance or known physiological condition: Secondary | ICD-10-CM

## 2017-10-08 LAB — SALICYLATE LEVEL: Salicylate Lvl: <7 mg/dL (ref 2.8–30.0)

## 2017-10-08 LAB — COMPREHENSIVE METABOLIC PANEL
ALK PHOS: 59 U/L (ref 38–126)
ALT: 24 U/L (ref 14–54)
AST: 34 U/L (ref 15–41)
Albumin: 4.8 g/dL (ref 3.5–5.0)
Anion gap: 7 (ref 5–15)
BILIRUBIN TOTAL: 0.6 mg/dL (ref 0.3–1.2)
BUN: 9 mg/dL (ref 6–20)
CALCIUM: 9.6 mg/dL (ref 8.9–10.3)
CHLORIDE: 101 mmol/L (ref 101–111)
CO2: 27 mmol/L (ref 22–32)
CREATININE: 0.87 mg/dL (ref 0.44–1.00)
GFR calc Af Amer: >60 mL/min (ref >60)
Glucose, Bld: 172 mg/dL — ABNORMAL HIGH (ref 65–99)
Potassium: 3.7 mmol/L (ref 3.5–5.1)
Sodium: 135 mmol/L (ref 135–145)
Total Protein: 8.5 g/dL — ABNORMAL HIGH (ref 6.5–8.1)

## 2017-10-08 LAB — CBC
HCT: 42 % (ref 35.0–47.0)
Hemoglobin: 14.7 g/dL (ref 12.0–16.0)
MCH: 29.2 pg (ref 26.0–34.0)
MCHC: 34.9 g/dL (ref 32.0–36.0)
MCV: 83.6 fL (ref 80.0–100.0)
PLATELETS: 277 10*3/uL (ref 150–440)
RBC: 5.02 MIL/uL (ref 3.80–5.20)
RDW: 13.8 % (ref 11.5–14.5)
WBC: 10.3 10*3/uL (ref 3.6–11.0)

## 2017-10-08 LAB — URINE DRUG SCREEN, QUALITATIVE (ARMC ONLY)
Amphetamines, Ur Screen: POSITIVE — AB
Barbiturates, Ur Screen: NOT DETECTED
Benzodiazepine, Ur Scrn: NOT DETECTED
COCAINE METABOLITE, UR ~~LOC~~: NOT DETECTED
Cannabinoid 50 Ng, Ur ~~LOC~~: POSITIVE — AB
MDMA (ECSTASY) UR SCREEN: NOT DETECTED
Methadone Scn, Ur: NOT DETECTED
Opiate, Ur Screen: NOT DETECTED
PHENCYCLIDINE (PCP) UR S: NOT DETECTED
Tricyclic, Ur Screen: NOT DETECTED

## 2017-10-08 LAB — ETHANOL

## 2017-10-08 LAB — ACETAMINOPHEN LEVEL: Acetaminophen (Tylenol), Serum: <10 ug/mL — ABNORMAL LOW (ref 10–30)

## 2017-10-08 MED ORDER — LORAZEPAM 1 MG PO TABS
1.0000 mg | ORAL_TABLET | Freq: Once | ORAL | Status: AC
  Administered 2017-10-08: 1 mg via ORAL
  Filled 2017-10-08: qty 1

## 2017-10-08 NOTE — ED Notes (Signed)
Pt's grandmother given a copy of rules and patient's password with patient permission.

## 2017-10-08 NOTE — ED Notes (Signed)
Hourly rounding reveals patient talking to TTS and less anxious in room. No complaints, stable, in no acute distress. Q15 minute rounds and monitoring via Psychologist, counselling to continue.

## 2017-10-08 NOTE — ED Triage Notes (Addendum)
Pt presents to ED via POV with her grandmother. Pt's grandmother reports that patient is "not herself". Pt states "I'm going to die". Pt's grandmother reports that patient stopped the car earlier today and prayed for more than 30 minutes. Pt appears super anxious and fearful during triage. Pt states that today "god told [her] she was going to die". Pt states auditor hallucinations, appears to be responding to auditory stimuli in the room.

## 2017-10-08 NOTE — ED Notes (Signed)
Hourly rounding reveals patient sleeping in room. No complaints, stable, in no acute distress. Q15 minute rounds and monitoring via Rover and Officer to continue.  

## 2017-10-08 NOTE — ED Notes (Signed)
Called [facility] for consult [phone]  IVC
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
## 2017-10-08 NOTE — ED Notes (Signed)
Hourly rounding reveals patient in room. Pt. Still anxious but stable stable, in no acute distress. Q15 minute rounds and monitoring via Psychologist, counselling to continue.

## 2017-10-08 NOTE — ED Notes (Signed)
Hourly rounding reveals patient in room still anxious.Stable, in no acute distress. Q15 minute rounds and monitoring via Psychologist, counselling to continue.

## 2017-10-08 NOTE — ED Provider Notes (Addendum)
[facility] Emergency Department Provider Note  ____________________________________________   I have reviewed the triage vital signs and the nursing notes. Where available I have reviewed prior notes and, if possible and indicated, outside hospital notes.    HISTORY  Chief Complaint Psychiatric Evaluation    HPI Kelly Peterson is a 37 y.o. female who presents today eating that she is hearing voices from God telling her that she is going to die.  She is very anxious about this.  She does have a history of a mood disorder, is unclear how long she has been hearing these voices telling her that God wants her to die she does not have SI or HI but she is very scared about this.  Limited history.  She denies taking an overdose. Level 5 chart caveat; no further history available due to patient status.   Past Medical History:  Diagnosis Date  . Endometriosis   . GERD (gastroesophageal reflux disease)    NO MEDS  . Mood disorder (HCC) unk  . Stomach ulcer     Patient Active Problem List   Diagnosis Date Noted  . Intractable pain 02/08/2016  . Mood disorder (HCC) 02/08/2016    Past Surgical History:  Procedure Laterality Date  . DILATION AND CURETTAGE OF UTERUS    . ESOPHAGOGASTRODUODENOSCOPY (EGD) WITH PROPOFOL N/A 01/23/2015   Procedure: ESOPHAGOGASTRODUODENOSCOPY (EGD) WITH PROPOFOL;  Surgeon: Christena Deem, MD;  Location: [facility] ENDOSCOPY;  Service: Endoscopy;  Laterality: N/A;    Prior to Admission medications   Medication Sig Start Date End Date Taking? Authorizing Provider  carbamide peroxide (DEBROX) 6.5 % OTIC solution Place 5 drops into the right ear 2 (two) times daily. 07/15/17 07/15/18  Joni Reining, PA-C  citalopram (CELEXA) 40 MG tablet Take 40 mg by mouth daily.    [provider]  diazepam (VALIUM) 5 MG tablet Take 1 tablet (5 mg total) by mouth every 8 (eight) hours as needed for muscle spasms. 03/08/16   Charlynne Pander, MD   gabapentin (NEURONTIN) 300 MG capsule Take 300 mg by mouth 3 (three) times daily.     [provider]  hydrocortisone (PROCTOCORT) 1 % CREA Apply 1 application topically 3 (three) times daily. 04/18/16   Joni Reining, PA-C  meloxicam (MOBIC) 15 MG tablet Take 1 tablet (15 mg total) by mouth daily. 08/24/16   Cuthriell, Delorise Royals, PA-C  methocarbamol (ROBAXIN) 500 MG tablet Take 1 tablet (500 mg total) by mouth 4 (four) times daily. 08/24/16   Cuthriell, Delorise Royals, PA-C  naproxen (NAPROSYN) 500 MG tablet Take 1 tablet (500 mg total) by mouth 2 (two) times daily with a meal. 07/15/17   Joni Reining, PA-C  tiZANidine (ZANAFLEX) 2 MG tablet Take 1 tablet by mouth 3 (three) times daily as needed. 02/11/16   [provider]  traZODone (DESYREL) 50 MG tablet Take 50 mg by mouth at bedtime.    [provider]    Allergies Morphine and related  Family History  Problem Relation Age of Onset  . Breast cancer Mother   . Bipolar disorder Father     Social History Social History   Tobacco Use  . Smoking status: Never Smoker  . Smokeless tobacco: Never Used  Substance Use Topics  . Alcohol use: No  . Drug use: No    Review of Systems Constitutional: No fever/chills Eyes: No visual changes. ENT: No sore throat. No stiff neck no neck pain Cardiovascular: Denies chest pain.  Respiratory: Denies shortness of breath. Gastrointestinal:   no vomiting.  No diarrhea.  No constipation. Genitourinary: Negative for dysuria. Musculoskeletal: Negative lower extremity swelling Skin: Negative for rash. Neurological: Negative for severe headaches, focal weakness or numbness.   ____________________________________________   PHYSICAL EXAM:  VITAL SIGNS: ED Triage Vitals [10/08/17 1858]  Enc Vitals Group     BP 122/90     Pulse Rate (!) 105     Resp 20     Temp 98.8 F (37.1 C)     Temp Source Oral     SpO2 100 %     Weight 120 lb (54.4 kg)     Height  (1.6  m)     Head Circumference      Peak Flow      Pain Score 0     Pain Loc      Pain Edu?      Excl. in GC?     Constitutional: Alert and oriented.  Very very anxious and concerned seem to be responding to internal stimuli Eyes: Conjunctivae are normal Head: Atraumatic HEENT: No congestion/rhinnorhea. Mucous membranes are moist.  Oropharynx non-erythematous Neck:   Nontender with no meningismus, no masses, no stridor Cardiovascular: Normal rate, regular rhythm. Grossly normal heart sounds.  Good peripheral circulation. Respiratory: Normal respiratory effort.  No retractions. Lungs CTAB. Abdominal: Soft and nontender. No distention. No guarding no rebound Back:  There is no focal tenderness or step off.  there is no midline tenderness there are no lesions noted. there is no CVA tenderness Musculoskeletal: No lower extremity tenderness, no upper extremity tenderness. No joint effusions, no DVT signs strong distal pulses no edema Neurologic:  Normal speech and language. No gross focal neurologic deficits are appreciated.  Skin:  Skin is warm, dry and intact. No rash noted. Psychiatric: Mood and affect are bizarre but conversant, states that she is hearing God's voice telling her that she will die  ____________________________________________   LABS (all labs ordered are listed, but only abnormal results are displayed)  Labs Reviewed  COMPREHENSIVE METABOLIC PANEL - Abnormal; Notable for the following components:      Result Value   Glucose, Bld 172 (*)    Total Protein 8.5 (*)    All other components within normal limits  ACETAMINOPHEN LEVEL - Abnormal; Notable for the following components:   Acetaminophen (Tylenol), Serum <10 (*)    All other components within normal limits  ETHANOL  SALICYLATE LEVEL  CBC  URINE DRUG SCREEN, QUALITATIVE ([facility] ONLY)  POC URINE PREG, ED    Pertinent labs  results that were available during my care of the patient were reviewed by me and considered  in my medical decision making (see chart for details). ____________________________________________  EKG  I personally interpreted any EKGs ordered by me or triage  ____________________________________________  RADIOLOGY  Pertinent labs & imaging results that were available during my care of the patient were reviewed by me and considered in my medical decision making (see chart for details). If possible, patient and/or family made aware of any abnormal findings.  No results found. ____________________________________________    PROCEDURES  Procedure(s) performed: None  Procedures  Critical Care performed: None  ____________________________________________   INITIAL IMPRESSION / ASSESSMENT AND PLAN / ED COURSE  Pertinent labs & imaging results that were available during my care of the patient were reviewed by me and considered in my medical decision making (see chart for details).  Patient here with delusions that the divinity is  telling her she will die.  Is very very anxious about this.  Will give her some oral anti-anxiety medications.  I do not know how divergent this is from her baseline but obviously is concerning.  Does not appear to be an acute organic pathology.  Patient likely will require IVC for further psychiatric evaluation if medical clearance continues  ----------------------------------------- 8:39 PM on 10/08/2017 -----------------------------------------  D/w grandmother who states she has bipolar and hx of drug abuse. 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  She says its been going on for several days.  Not taking her meds she believes but is not sure. Pt got out of stopped car and was running away today. No injury. Talking about hearing god.  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  Despite best efforts to proofread,  errors can occur which can change meaning.      Jeanmarie Plant, MD 10/08/17 2033    Jeanmarie Plant, MD 10/08/17 2041    Jeanmarie Plant, MD 10/08/17 2256    Jeanmarie Plant, MD 10/08/17 2256

## 2017-10-08 NOTE — ED Notes (Signed)
Pt. Back from Triage to room 22. Pt. Oriented to unit including Q15 minute rounds and Officer and Rover for her protection. Patient is alert, warm and dry in no acute distress. Patient denies SI, HI, and AVH. Pt. Encouraged to let me know if needs arise. Pt. Anxious and paranoid and hyper religious.

## 2017-10-08 NOTE — ED Notes (Signed)
FIRST NURSE NOTE: Patient here with mother, states she is here voices, pt states she is hearing God.  Pt tearful in lobby.

## 2017-10-09 ENCOUNTER — Inpatient Hospital Stay
Admission: AD | Admit: 2017-10-09 | Discharge: 2017-10-13 | DRG: 885 | Disposition: A | Discharge status: Home / Self Care | Payer: No Typology Code available for payment source | Source: Intra-hospital | Attending: Psychiatry | Admitting: Psychiatry

## 2017-10-09 ENCOUNTER — Other Ambulatory Visit: Payer: Self-pay

## 2017-10-09 DIAGNOSIS — F419 Anxiety disorder, unspecified: Secondary | ICD-10-CM | POA: Diagnosis present

## 2017-10-09 DIAGNOSIS — F23 Brief psychotic disorder: Secondary | ICD-10-CM | POA: Diagnosis present

## 2017-10-09 DIAGNOSIS — Z8711 Personal history of peptic ulcer disease: Secondary | ICD-10-CM

## 2017-10-09 DIAGNOSIS — F312 Bipolar disorder, current episode manic severe with psychotic features: Secondary | ICD-10-CM | POA: Diagnosis not present

## 2017-10-09 DIAGNOSIS — F3110 Bipolar disorder, current episode manic without psychotic features, unspecified: Secondary | ICD-10-CM

## 2017-10-09 DIAGNOSIS — Z560 Unemployment, unspecified: Secondary | ICD-10-CM

## 2017-10-09 DIAGNOSIS — Z915 Personal history of self-harm: Secondary | ICD-10-CM | POA: Diagnosis not present

## 2017-10-09 DIAGNOSIS — R9431 Abnormal electrocardiogram [ECG] [EKG]: Secondary | ICD-10-CM | POA: Diagnosis present

## 2017-10-09 DIAGNOSIS — F1020 Alcohol dependence, uncomplicated: Secondary | ICD-10-CM

## 2017-10-09 DIAGNOSIS — F1220 Cannabis dependence, uncomplicated: Secondary | ICD-10-CM | POA: Diagnosis present

## 2017-10-09 DIAGNOSIS — Z79899 Other long term (current) drug therapy: Secondary | ICD-10-CM

## 2017-10-09 DIAGNOSIS — F1520 Other stimulant dependence, uncomplicated: Secondary | ICD-10-CM | POA: Diagnosis present

## 2017-10-09 DIAGNOSIS — F1310 Sedative, hypnotic or anxiolytic abuse, uncomplicated: Secondary | ICD-10-CM

## 2017-10-09 DIAGNOSIS — Z885 Allergy status to narcotic agent status: Secondary | ICD-10-CM

## 2017-10-09 DIAGNOSIS — G4700 Insomnia, unspecified: Secondary | ICD-10-CM | POA: Diagnosis present

## 2017-10-09 DIAGNOSIS — K219 Gastro-esophageal reflux disease without esophagitis: Secondary | ICD-10-CM | POA: Diagnosis present

## 2017-10-09 DIAGNOSIS — Z818 Family history of other mental and behavioral disorders: Secondary | ICD-10-CM | POA: Diagnosis not present

## 2017-10-09 MED ORDER — CHLORDIAZEPOXIDE HCL 25 MG PO CAPS
25.0000 mg | ORAL_CAPSULE | Freq: Four times a day (QID) | ORAL | Status: DC
  Administered 2017-10-10 – 2017-10-12 (×10): 25 mg via ORAL
  Filled 2017-10-09 (×10): qty 1

## 2017-10-09 MED ORDER — TRAZODONE HCL 100 MG PO TABS: 100.0000 mg | ORAL_TABLET | Freq: Every day | ORAL | Status: DC

## 2017-10-09 MED ORDER — OLANZAPINE 10 MG PO TBDP
15.0000 mg | ORAL_TABLET | Freq: Every day | ORAL | Status: DC
  Administered 2017-10-10: 15 mg via ORAL
  Administered 2017-10-10: 20 mg via ORAL
  Administered 2017-10-11: 15 mg via ORAL
  Filled 2017-10-09 (×4): qty 1.5
  Filled 2017-10-09 (×2): qty 3

## 2017-10-09 MED ORDER — ALUM & MAG HYDROXIDE-SIMETH 200-200-20 MG/5ML PO SUSP: 30.0000 mL | ORAL | Status: DC | PRN

## 2017-10-09 MED ORDER — TRAZODONE HCL 100 MG PO TABS
100.0000 mg | ORAL_TABLET | Freq: Every day | ORAL | Status: DC
  Administered 2017-10-09 – 2017-10-12 (×4): 100 mg via ORAL
  Filled 2017-10-09 (×4): qty 1

## 2017-10-09 MED ORDER — ACETAMINOPHEN 325 MG PO TABS
650.0000 mg | ORAL_TABLET | Freq: Four times a day (QID) | ORAL | Status: DC | PRN
  Administered 2017-10-11: 650 mg via ORAL
  Filled 2017-10-09: qty 2

## 2017-10-09 MED ORDER — HYDROXYZINE HCL 50 MG PO TABS
50.0000 mg | ORAL_TABLET | Freq: Three times a day (TID) | ORAL | Status: DC | PRN
Start: 2017-10-09 — End: 2017-10-13
  Administered 2017-10-09 – 2017-10-13 (×4): 50 mg via ORAL
  Filled 2017-10-09 (×4): qty 1

## 2017-10-09 MED ORDER — MAGNESIUM HYDROXIDE 400 MG/5ML PO SUSP: 30.0000 mL | Freq: Every day | ORAL | Status: DC | PRN

## 2017-10-09 NOTE — ED Notes (Signed)
Referral information for Psychiatric Hospitalization faxed to;   [facility] [phone]),   . [facility] [phone]),   . [facility] [phone] or [phone])  . [facility] [phone]),   . [facility] [phone]),   . [facility] [phone])

## 2017-10-09 NOTE — Consult Note (Signed)
[facility] Psychiatry Consult   Reason for Consult: Consult for 37 year old woman with a history of substance abuse and mood disorder came into the hospital with confusion Referring Physician: Quale Patient Identification: Kelly Peterson MRN:  [id number] Principal Diagnosis: Bipolar disorder, manic ([place]) Diagnosis:   Patient Active Problem List   Diagnosis Date Noted  . 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 Alcohol abuse [F10.10] 10/09/2017  . Benzodiazepine abuse ([place]) [F13.10] 10/09/2017  . Bipolar disorder, manic ([place]) [F31.10] 10/09/2017  . Intractable pain [R52] 02/08/2016  . Mood disorder ([place]) [F39] 02/08/2016    Total Time spent with patient: 1 hour  Subjective:   Kelly Peterson is a 37 y.o. female patient admitted with "I stop my medicines and now I am feeling weird".  HPI: Patient interviewed chart reviewed.  37 year old woman with a history of substance abuse and mood and behavior problems.  Came into the hospital with confusion and anxiety.  She tells me that she stopped taking all of her medicines and all of her drugs last Wednesday.  After that she had several days in which she was not sleeping at all.  She says for the past few days she has been feeling like she was going to die.  Denies that this was anything like a panic attack.  No racing heartbeat no shortness of breath but just felt fear all over.  Not sleeping for a few days.  Does not think that she has had any hallucinations.  Denies any suicidal intent.  Patient admits that she had been using Adderall and also marijuana and drinking.  Cannot really remember how much of any of that she was doing.  Social history: Lives with her boyfriend and her 33 year old son.  Not working outside the home.  Medical history: No significant medical problems outside of her mental health issues  Substance abuse history: History of abuse of multiple drugs including amphetamines and benzodiazepines and  marijuana  Past Psychiatric History: Patient has had previous hospitalizations and previous presentations to the emergency room.  Unclear at times whether she was having a mood disorder.  Has been treated with antidepressants in the past.  This current episode is probably the most disorganized that she has been coming into the hospital.  Does have some insight into her substance abuse.  Denies any history of seizures or delirium tremens  Risk to Self: Suicidal Ideation: No Suicidal Intent: No Is patient at risk for suicide?: No Suicidal Plan?: No Access to Means: No What has been your use of drugs/alcohol within the last 12 months?: History of drug abuse How many times?: 0 Other Self Harm Risks: denied Triggers for Past Attempts: None known Intentional Self Injurious Behavior: None Risk to Others: Homicidal Ideation: No Thoughts of Harm to Others: No Current Homicidal Intent: No Current Homicidal Plan: No Access to Homicidal Means: No Identified Victim: None identified History of harm to others?: No Assessment of Violence: None Noted Violent Behavior Description: denied Does patient have access to weapons?: No Criminal Charges Pending?: No Does patient have a court date: No Prior Inpatient Therapy: Prior Inpatient Therapy: Yes Prior Therapy Dates: 2005 Prior Therapy Facilty/Provider(s): [facility] Reason for Treatment: Schizophrenia, substance abuse Prior Outpatient Therapy: Prior Outpatient Therapy: No Does patient have an ACCT team?: No Does patient have Intensive In-House Services?  : No Does patient have [facility] services? : No Does patient have P4CC services?: No  Past Medical History:  Past Medical History:  Diagnosis Date  . Endometriosis   .  GERD (gastroesophageal reflux disease)    NO MEDS  . Mood disorder ([place]) unk  . Stomach ulcer     Past Surgical History:  Procedure Laterality Date  . DILATION AND CURETTAGE OF UTERUS    . ESOPHAGOGASTRODUODENOSCOPY (EGD) WITH  PROPOFOL N/A 01/23/2015   Procedure: ESOPHAGOGASTRODUODENOSCOPY (EGD) WITH PROPOFOL;  Surgeon: Lollie Sails, MD;  Location: [facility] ENDOSCOPY;  Service: Endoscopy;  Laterality: N/A;   Family History:  Family History  Problem Relation Age of Onset  . Breast cancer Mother   . Bipolar disorder Father    Family Psychiatric  History: None known Social History:  Social History   Substance and Sexual Activity  Alcohol Use No     Social History   Substance and Sexual Activity  Drug Use No    Social History   Socioeconomic History  . Marital status: Single    Spouse name: Not on file  . Number of children: Not on file  . Years of education: Not on file  . Highest education level: Not on file  Occupational History  . Not on file  Social Needs  . Financial resource strain: Not on file  . Food insecurity:    Worry: Not on file    Inability: Not on file  . Transportation needs:    Medical: Not on file    Non-medical: Not on file  Tobacco Use  . Smoking status: Never Smoker  . Smokeless tobacco: Never Used  Substance and Sexual Activity  . Alcohol use: No  . Drug use: No  . Sexual activity: Not on file  Lifestyle  . Physical activity:    Days per week: Not on file    Minutes per session: Not on file  . Stress: Not on file  Relationships  . Social connections:    Talks on phone: Not on file    Gets together: Not on file    Attends religious service: Not on file    Active member of club or organization: Not on file    Attends meetings of clubs or organizations: Not on file    Relationship status: Not on file  Other Topics Concern  . Not on file  Social History Narrative  . Not on file   Additional Social History:    Allergies:   Allergies  Allergen Reactions  . Morphine And Related Itching and Rash    Labs:  Results for orders placed or performed during the hospital encounter of 10/08/17 (from the past 48 hour(s))  Urine Drug Screen, Qualitative     Status:  Abnormal   Collection Time: 10/08/17  7:00 PM  Result Value Ref Range   Tricyclic, Ur Screen NONE DETECTED NONE DETECTED   Amphetamines, Ur Screen POSITIVE (A) NONE DETECTED   MDMA (Ecstasy)Ur Screen NONE DETECTED NONE DETECTED   Cocaine Metabolite,Ur [place] NONE DETECTED NONE DETECTED   Opiate, Ur Screen NONE DETECTED NONE DETECTED   Phencyclidine (PCP) Ur S NONE DETECTED NONE DETECTED   Cannabinoid 50 Ng, Ur [place] POSITIVE (A) NONE DETECTED   Barbiturates, Ur Screen NONE DETECTED NONE DETECTED   Benzodiazepine, Ur Scrn NONE DETECTED NONE DETECTED   Methadone Scn, Ur NONE DETECTED NONE DETECTED    Comment: (NOTE) Tricyclics + metabolites, urine    Cutoff 1000 ng/mL Amphetamines + metabolites, urine  Cutoff 1000 ng/mL MDMA (Ecstasy), urine              Cutoff 500 ng/mL Cocaine Metabolite, urine  Cutoff 300 ng/mL Opiate + metabolites, urine        Cutoff 300 ng/mL Phencyclidine (PCP), urine         Cutoff 25 ng/mL Cannabinoid, urine                 Cutoff 50 ng/mL Barbiturates + metabolites, urine  Cutoff 200 ng/mL Benzodiazepine, urine              Cutoff 200 ng/mL Methadone, urine                   Cutoff 300 ng/mL The urine drug screen provides only a preliminary, unconfirmed analytical test result and should not be used for non-medical purposes. Clinical consideration and professional judgment should be applied to any positive drug screen result due to possible interfering substances. A more specific alternate chemical method must be used in order to obtain a confirmed analytical result. Gas chromatography / mass spectrometry (GC/MS) is the preferred confirmat ory method. Performed at [facility], [address] [place], [place] [address]   Comprehensive metabolic panel     Status: Abnormal   Collection Time: 10/08/17  7:03 PM  Result Value Ref Range   Sodium 135 135 - 145 mmol/L   Potassium 3.7 3.5 - 5.1 mmol/L   Chloride 101 101 - 111 mmol/L   CO2 27 22 -  32 mmol/L   Glucose, Bld 172 (H) 65 - 99 mg/dL   BUN 9 6 - 20 mg/dL   Creatinine, Ser 0.87 0.44 - 1.00 mg/dL   Calcium 9.6 8.9 - 10.3 mg/dL   Total Protein 8.5 (H) 6.5 - 8.1 g/dL   Albumin 4.8 3.5 - 5.0 g/dL   AST 34 15 - 41 U/L   ALT 24 14 - 54 U/L   Alkaline Phosphatase 59 38 - 126 U/L   Total Bilirubin 0.6 0.3 - 1.2 mg/dL   GFR calc non Af Amer >60 >60 mL/min   GFR calc Af Amer >60 >60 mL/min    Comment: (NOTE) The eGFR has been calculated using the CKD EPI equation. This calculation has not been validated in all clinical situations. eGFR's persistently <60 mL/min signify possible Chronic Kidney Disease.    Anion gap 7 5 - 15    Comment: Performed at [facility], [address] [place], [place] [address]  Ethanol     Status: None   Collection Time: 10/08/17  7:03 PM  Result Value Ref Range   Alcohol, Ethyl (B) <10 <10 mg/dL    Comment: (NOTE) Lowest detectable limit for serum alcohol is 10 mg/dL. For medical purposes only. Performed at [facility], [address] [place], [place] [address]   Salicylate level     Status: None   Collection Time: 10/08/17  7:03 PM  Result Value Ref Range   Salicylate Lvl <3.5 2.8 - 30.0 mg/dL    Comment: Performed at [facility], [address] [place], [place] [address]  Acetaminophen level     Status: Abnormal   Collection Time: 10/08/17  7:03 PM  Result Value Ref Range   Acetaminophen (Tylenol), Serum <10 (L) 10 - 30 ug/mL    Comment: (NOTE) Therapeutic concentrations vary significantly. A range of 10-30 ug/mL  may be an effective concentration for many patients. However, some  are best treated at concentrations outside of this range. Acetaminophen concentrations >150 ug/mL at 4 hours after ingestion  and >50 ug/mL at 12 hours after ingestion are often associated with  toxic reactions. Performed at [facility], [address] [place], [place] [address]   cbc     Status: None    Collection Time: 10/08/17  7:03 PM  Result Value Ref Range   WBC 10.3 3.6 - 11.0 K/uL   RBC 5.02 3.80 - 5.20 MIL/uL   Hemoglobin 14.7 12.0 - 16.0 g/dL   HCT 42.0 35.0 - 47.0 %   MCV 83.6 80.0 - 100.0 fL   MCH 29.2 26.0 - 34.0 pg   MCHC 34.9 32.0 - 36.0 g/dL   RDW 13.8 11.5 - 14.5 %   Platelets 277 150 - 440 K/uL    Comment: Performed at [facility], [address], [place],  [address]    Current Facility-Administered Medications  Medication Dose Route Frequency Provider Last Rate Last Dose  . traZODone (DESYREL) tablet 100 mg  100 mg Oral QHS Clapacs, Madie Reno, MD       Current Outpatient Medications  Medication Sig Dispense Refill  . carbamide peroxide (DEBROX) 6.5 % OTIC solution Place 5 drops into the right ear 2 (two) times daily. 15 mL 2  . citalopram (CELEXA) 40 MG tablet Take 40 mg by mouth daily.    . diazepam (VALIUM) 5 MG tablet Take 1 tablet (5 mg total) by mouth every 8 (eight) hours as needed for muscle spasms. 10 tablet 0  . gabapentin (NEURONTIN) 300 MG capsule Take 300 mg by mouth 3 (three) times daily.     . hydrocortisone (PROCTOCORT) 1 % CREA Apply 1 application topically 3 (three) times daily. 10 g 0  . meloxicam (MOBIC) 15 MG tablet Take 1 tablet (15 mg total) by mouth daily. 30 tablet 0  . methocarbamol (ROBAXIN) 500 MG tablet Take 1 tablet (500 mg total) by mouth 4 (four) times daily. 16 tablet 0  . naproxen (NAPROSYN) 500 MG tablet Take 1 tablet (500 mg total) by mouth 2 (two) times daily with a meal. 20 tablet 00  . tiZANidine (ZANAFLEX) 2 MG tablet Take 1 tablet by mouth 3 (three) times daily as needed.  0  . traZODone (DESYREL) 50 MG tablet Take 50 mg by mouth at bedtime.      Musculoskeletal: Strength & Muscle Tone: within normal limits Gait & Station: normal Patient leans: N/A  Psychiatric Specialty Exam: Physical Exam  Nursing note and vitals reviewed. Constitutional: She appears well-developed and well-nourished.  HENT:  Head:  Normocephalic and atraumatic.  Eyes: Pupils are equal, round, and reactive to light. Conjunctivae are normal.  Neck: Normal range of motion.  Cardiovascular: Normal heart sounds.  Respiratory: Effort normal. No respiratory distress.  GI: Soft.  Musculoskeletal: Normal range of motion.  Neurological: She is alert.  Skin: Skin is warm and dry.  Psychiatric: Her affect is blunt. Her speech is delayed and tangential. She is slowed. Thought content is paranoid. Cognition and memory are impaired. She expresses impulsivity. She expresses no suicidal ideation.    Review of Systems  Constitutional: Negative.   HENT: Negative.   Eyes: Negative.   Respiratory: Negative.   Cardiovascular: Negative.   Gastrointestinal: Negative.   Musculoskeletal: Negative.   Skin: Negative.   Neurological: Negative.   Psychiatric/Behavioral: Positive for depression, hallucinations, memory loss and substance abuse. Negative for suicidal ideas. The patient is nervous/anxious and has insomnia.     Blood pressure 122/90, pulse (!) 105, temperature 98.8 F (37.1 C), temperature source Oral, resp. rate 20, height 5' 3" (1.6 m), weight 54.4 kg (120 lb), SpO2 100 %.  Body mass index is 21.26 kg/m.  General Appearance: Disheveled  Eye Contact:  Fair  Speech:  Slow  Volume:  Decreased  Mood:  Anxious and Dysphoric  Affect:  Constricted  Thought Process:  Disorganized  Orientation:  Full (Time, Place, and Person)  Thought Content:  Illogical, Rumination and Tangential  Suicidal Thoughts:  No  Homicidal Thoughts:  No  Memory:  Immediate;   Fair Recent;   Poor Remote;   Poor  Judgement:  Impaired  Insight:  Shallow  Psychomotor Activity:  Decreased  Concentration:  Concentration: Poor  Recall:  Poor  Fund of Knowledge:  Good  Language:  Fair  Akathisia:  No  Handed:  Right  AIMS (if indicated):     Assets:  Desire for Improvement Housing Physical Health  ADL's:  Impaired  Cognition:  Impaired,  Mild   Sleep:        Treatment Plan Summary: Daily contact with patient to assess and evaluate symptoms and progress in treatment, Medication management and Plan 37 year old woman with a history of substance abuse comes into the hospital disorganized confused mood instability panicky.  Feeling like she is going to die.  On presentation today she is cognitively impaired.  Very slow to answer questions.  Confused in her thinking.  Does not currently seem to be able to make reasonable decisions to care for herself.  Differential diagnosis includes amphetamine psychosis, bipolar disorder, alcohol withdrawal.  Patient does not have benzodiazepines in her system this time that I can tell although it is possible they may have been there and now she is off them a few days.  Patient merits hospitalization because of ongoing confusion and psychotic thinking.  Orders completed.  Patient agrees to plan.  Monitor for detox.  Of labs as needed antipsychotic.  Hold off on any other antidepressants and especially not on stimulants  Disposition: Recommend psychiatric Inpatient admission when medically cleared. Supportive therapy provided about ongoing stressors. Discussed crisis plan, support from social network, calling 911, coming to the Emergency Department, and calling Suicide Hotline.  Alethia Berthold, MD 10/09/2017 3:03 PM

## 2017-10-09 NOTE — ED Notes (Signed)
Nurse talked to Patient's mom , she had password, Mom is very supportive and concerned, states that her daughter needs not only help for depression, but also for addiction. Nurse let her know that referrals have went out to different hospitals, awaiting response. Patient is safe and q 15 minute checks in progress.

## 2017-10-09 NOTE — ED Notes (Signed)
Hourly rounding reveals patient sleeping in room. No complaints, stable, in no acute distress. Q15 minute rounds and monitoring via Rover and Officer to continue.  

## 2017-10-09 NOTE — ED Notes (Signed)
Dr. Clapacs talking with Patient, Patient remains calm and cooperative.  

## 2017-10-09 NOTE — H&P (Signed)
Psychiatric Admission Assessment Adult  Patient Identification: Kelly Peterson MRN:  [id number] Date of Evaluation:  10/10/2017 Chief Complaint:  Bipolar disorder, manic  Principal Diagnosis: Bipolar I disorder, current or most recent episode manic, with psychotic features (HCC) Diagnosis:   Patient Active Problem List   Diagnosis Date Noted  . Bipolar I disorder, current or most recent episode manic, with psychotic features (HCC) [F31.2] 10/09/2017    Priority: High  . 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 Alcohol use disorder, moderate, dependence (HCC) [F10.20] 10/09/2017  . 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 Cannabis use disorder, moderate, dependence (HCC) [F12.20] 10/09/2017  . Intractable pain [R52] 02/08/2016  . Mood disorder (HCC) [F39] 02/08/2016   History of Present Illness:   Identifying data. Kelly Peterson is a 37 year old female with a history of depression.  Chief complaint. "I woke up this morning and I am not dead!"  History of present illness. Information was obtained from the patient and the chart. The patient was brought to the ER by her grandmother for strange behavior. The patient started hearing the voice of God, praying loudly and became convinced that she is about to die. This was accompanied with feeling of impeding doom and palpitations. She has not been sleeping lately. She has been frightened and tearful in the ER and later on the unit. She reports using amphetamines, mostly Adderall that she gets off the street, "a lot". She then stopped using it "cold [place]". She has also been drinking more than usual but denies drinking problem. She is unable to identify any other stressors. She has a history of depression and mood instability. There is a history of crystal meth use but lately she takes pills.   This morning, the patient is cool and collected. She is definitely glad to be alive. Slept well. Feels better.  Past  psychiatric history. Hospitalized at [facility] three times in 2006, 2014 and 2015. She was possibly hospitalized other places but is not a good historian. She only remember Neurontin and Seroquel. Seroquel made her feel weird. One suicide attempt by drug overdose while in relationship problems.   Family psychiatric history. Father with bipolar disorder.    Social history. She has GEDs. She lives with her boyfriend and her 36 year old son. 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   Total Time spent with patient: 1 hour  Is the patient at risk to self? Yes.    Has the patient been a risk to self in the past 6 months? No.  Has the patient been a risk to self within the distant past? No.  Is the patient a risk to others? No.  Has the patient been a risk to others in the past 6 months? No.  Has the patient been a risk to others within the distant past? No.   Prior Inpatient Therapy:   Prior Outpatient Therapy:    Alcohol Screening: 1. How often do you have a drink containing alcohol?: 2 to 4 times a month 2. How many drinks containing alcohol do you have on a typical day when you are drinking?: 3 or 4 3. How often do you have six or more drinks on one occasion?: Monthly AUDIT-C Score: 5 4. How often during the last year have you found that you were not able to stop drinking once you had started?: Never 5. How often during the last year have you failed to do what was  normally expected from you becasue of drinking?: Never 6. How often during the last year have you needed a first drink in the morning to get yourself going after a heavy drinking session?: Never 7. How often during the last year have you had a feeling of guilt of remorse after drinking?: Never 8. How often during the last year have you been unable to remember what happened the night before because you had been drinking?: Never 9. Have you or someone else been injured as a result of your drinking?:  No 10. Has a relative or friend or a doctor or another health worker been concerned about your drinking or suggested you cut down?: No Alcohol Use Disorder Identification Test Final Score (AUDIT): 5 Intervention/Follow-up: AUDIT Score <7 follow-up not indicated Substance Abuse History in the last 12 months:  Yes.   Consequences of Substance Abuse: Negative Previous Psychotropic Medications: Yes  Psychological Evaluations: No  Past Medical History:  Past Medical History:  Diagnosis Date  . Endometriosis   . GERD (gastroesophageal reflux disease)    NO MEDS  . Mood disorder (HCC) unk  . Stomach ulcer     Past Surgical History:  Procedure Laterality Date  . DILATION AND CURETTAGE OF UTERUS    . ESOPHAGOGASTRODUODENOSCOPY (EGD) WITH PROPOFOL N/A 01/23/2015   Procedure: ESOPHAGOGASTRODUODENOSCOPY (EGD) WITH PROPOFOL;  Surgeon: Christena Deem, MD;  Location: [facility] ENDOSCOPY;  Service: Endoscopy;  Laterality: N/A;   Family History:  Family History  Problem Relation Age of Onset  . Breast cancer Mother   . Bipolar disorder Father    Tobacco Screening: Have you used any form of tobacco in the last 30 days? (Cigarettes, Smokeless Tobacco, Cigars, and/or Pipes): No Social History:  Social History   Substance and Sexual Activity  Alcohol Use No     Social History   Substance and Sexual Activity  Drug Use No    Additional Social History:                           Allergies:   Allergies  Allergen Reactions  . Morphine And Related Itching and Rash   Lab Results:  Results for orders placed or performed during the hospital encounter of 10/09/17 (from the past 48 hour(s))  Troponin I (q 6hr x 3)     Status: None   Collection Time: 10/10/17 12:21 AM  Result Value Ref Range   Troponin I <0.03 <0.03 ng/mL    Comment: Performed at [facility], [address] [place], [place] [address]  Lipid panel     Status: Abnormal   Collection Time: 10/10/17  7:10 AM   Result Value Ref Range   Cholesterol 193 0 - 200 mg/dL   Triglycerides 65 <604 mg/dL   HDL 51 >54 mg/dL   Total CHOL/HDL Ratio 3.8 RATIO   VLDL 13 0 - 40 mg/dL   LDL Cholesterol 098 (H) 0 - 99 mg/dL    Comment:        Total Cholesterol/HDL:CHD Risk Coronary Heart Disease Risk Table                     Men   Women  1/2 Average Risk   3.4   3.3  Average Risk       5.0   4.4  2 X Average Risk   9.6   7.1  3 X Average Risk  23.4   11.0  Use the calculated Patient Ratio above and the CHD Risk Table to determine the patient's CHD Risk.        ATP III CLASSIFICATION (LDL):  <100     mg/dL   Optimal  161-096  mg/dL   Near or Above                    Optimal  130-159  mg/dL   Borderline  045-409  mg/dL   High  >811     mg/dL   Very High Performed at [facility], [address] [place], [place] [address]   TSH     Status: None   Collection Time: 10/10/17  7:10 AM  Result Value Ref Range   TSH 3.822 0.350 - 4.500 uIU/mL    Comment: Performed by a 3rd Generation assay with a functional sensitivity of <=0.01 uIU/mL. Performed at [facility], [address] [place], [place] [address]   Troponin I (q 6hr x 3)     Status: None   Collection Time: 10/10/17  7:10 AM  Result Value Ref Range   Troponin I <0.03 <0.03 ng/mL    Comment: Performed at [facility], [address] [place], [place] [address]    Blood Alcohol level:  Lab Results  Component Value Date   [facility] <10 10/08/2017    Metabolic Disorder Labs:  No results found for: HGBA1C, MPG No results found for: PROLACTIN Lab Results  Component Value Date   CHOL 193 10/10/2017   TRIG 65 10/10/2017   HDL 51 10/10/2017   CHOLHDL 3.8 10/10/2017   VLDL 13 10/10/2017   LDLCALC 129 (H) 10/10/2017    Current Medications: Current Facility-Administered Medications  Medication Dose Route Frequency Provider Last Rate Last Dose  . acetaminophen (TYLENOL) tablet 650 mg  650 mg Oral Q6H PRN  Clapacs, John T, MD      . alum & mag hydroxide-simeth (MAALOX/MYLANTA) 200-200-20 MG/5ML suspension 30 mL  30 mL Oral Q4H PRN Clapacs, John T, MD      . chlordiazePOXIDE (LIBRIUM) capsule 25 mg  25 mg Oral QID Sydne Krahl B, MD   25 mg at 10/10/17 0831  . hydrOXYzine (ATARAX/VISTARIL) tablet 50 mg  50 mg Oral TID PRN Clapacs, Jackquline Denmark, MD   50 mg at 10/09/17 2250  . magnesium hydroxide (MILK OF MAGNESIA) suspension 30 mL  30 mL Oral Daily PRN Clapacs, John T, MD      . OLANZapine zydis (ZYPREXA) disintegrating tablet 15 mg  15 mg Oral QHS Jex Strausbaugh B, MD   15 mg at 10/10/17 0016  . traZODone (DESYREL) tablet 100 mg  100 mg Oral QHS Clapacs, John T, MD   100 mg at 10/09/17 2207   PTA Medications: Medications Prior to Admission  Medication Sig Dispense Refill Last Dose  . carbamide peroxide (DEBROX) 6.5 % OTIC solution Place 5 drops into the right ear 2 (two) times daily. 15 mL 2   . citalopram (CELEXA) 40 MG tablet Take 40 mg by mouth daily.     . diazepam (VALIUM) 5 MG tablet Take 1 tablet (5 mg total) by mouth every 8 (eight) hours as needed for muscle spasms. 10 tablet 0 prn at prn  . gabapentin (NEURONTIN) 300 MG capsule Take 300 mg by mouth 3 (three) times daily.    02/08/2016 at Unknown time  . hydrocortisone (PROCTOCORT) 1 % CREA Apply 1 application topically 3 (three) times daily. 10 g 0   .  meloxicam (MOBIC) 15 MG tablet Take 1 tablet (15 mg total) by mouth daily. 30 tablet 0   . methocarbamol (ROBAXIN) 500 MG tablet Take 1 tablet (500 mg total) by mouth 4 (four) times daily. 16 tablet 0   . naproxen (NAPROSYN) 500 MG tablet Take 1 tablet (500 mg total) by mouth 2 (two) times daily with a meal. 20 tablet 00   . tiZANidine (ZANAFLEX) 2 MG tablet Take 1 tablet by mouth 3 (three) times daily as needed.  0   . traZODone (DESYREL) 50 MG tablet Take 50 mg by mouth at bedtime.   02/07/2016 at Unknown time    Musculoskeletal: Strength & Muscle Tone: within normal limits Gait &  Station: normal Patient leans: N/A  Psychiatric Specialty Exam: Physical Exam  Nursing note and vitals reviewed. Constitutional: She is oriented to person, place, and time. She appears well-developed and well-nourished.  HENT:  Head: Normocephalic and atraumatic.  Eyes: Pupils are equal, round, and reactive to light. Conjunctivae and EOM are normal.  Neck: Normal range of motion. Neck supple.  Cardiovascular: Normal rate, regular rhythm and normal heart sounds.  Respiratory: Effort normal and breath sounds normal.  GI: Soft. Bowel sounds are normal.  Musculoskeletal: Normal range of motion.  Neurological: She is alert and oriented to person, place, and time.  Skin: Skin is warm and dry.  Psychiatric: Her mood appears anxious. Her affect is labile. Her speech is rapid and/or pressured. She is hyperactive and actively hallucinating. Thought content is paranoid and delusional. Cognition and memory are normal. She expresses impulsivity.    Review of Systems  Cardiovascular: Positive for palpitations.  Neurological: Negative.   Psychiatric/Behavioral: Positive for hallucinations and substance abuse. The patient is nervous/anxious and has insomnia.   All other systems reviewed and are negative.   Blood pressure (!) 144/95, pulse (!) 111, temperature 98.9 F (37.2 C), temperature source Oral, resp. rate 20, height  (1.6 m), weight 55.8 kg (123 lb), SpO2 100 %.Body mass index is 21.79 kg/m.  See SRA                                                  Sleep:  Number of Hours: 3.3    Treatment Plan Summary: Daily contact with patient to assess and evaluate symptoms and progress in treatment and Medication management   Kelly Peterson is a 37 year old female with a history of depression, mood instability and amphetamine abuse admitted for a psychotic break.  #Mood/psychosis -start Zyprexa 15 mg nightly  #Insomnia -Trazodone 100 mg nightly PRN  #Substance  abuse -positive for cannabis and amphetamines, admits to drinking -start Librium taper  #Abnormal EKG -troponin negative -QTc 457  #Labs -lipid panel, TSH and A1C -pregnancy test  #Disposition -discharge to home with family -follow up with [facility]   Observation Level/Precautions:  15 minute checks  Laboratory:  CBC Chemistry Profile UDS UA  Psychotherapy:    Medications:    Consultations:    Discharge Concerns:    Estimated LOS:  Other:     Physician Treatment Plan for Primary Diagnosis: Bipolar I disorder, current or most recent episode manic, with psychotic features (HCC) Long Term Goal(s): Improvement in symptoms so as ready for discharge  Short Term Goals: Ability to identify changes in lifestyle to reduce recurrence of condition will improve, Ability to verbalize feelings will improve,  Ability to disclose and discuss suicidal ideas, Ability to demonstrate self-control will improve, Ability to identify and develop effective coping behaviors will improve, Ability to maintain clinical measurements within normal limits will improve, Compliance with prescribed medications will improve and Ability to identify triggers associated with substance abuse/mental health issues will improve  Physician Treatment Plan for Secondary Diagnosis: Principal Problem:   Bipolar I disorder, current or most recent episode manic, with psychotic features (HCC) Active Problems:   Amphetamine use disorder, moderate (HCC)   Alcohol use disorder, moderate, dependence (HCC)   Cannabis use disorder, moderate, dependence (HCC)  Long Term Goal(s): Improvement in symptoms so as ready for discharge  Short Term Goals: Ability to identify changes in lifestyle to reduce recurrence of condition will improve, Ability to demonstrate self-control will improve and Ability to identify triggers associated with substance abuse/mental health issues will improve  I certify that inpatient services furnished can  reasonably be expected to improve the patient's condition.    Kristine Linea, MD 5/28/20199:13 AM

## 2017-10-09 NOTE — BH Assessment (Signed)
Patient is to be admitted to [facility] by Dr. Toni Amend.  Attending Physician will be Dr. Jennet Maduro.   Patient has been assigned to room 309, by [facility] Charge Nurse Gwen.   Intake Paper Work has been signed and placed on patient chart.  ER staff is aware of the admission:  [facility]   Dr. Fanny Bien, ER MD   Toniann Fail Patient's Nurse   Ethelene Browns Patient Access.

## 2017-10-09 NOTE — Consult Note (Signed)
Psychiatry: Brief note, full note to follow.  37 year old woman with a history of mood disorder and substance abuse.  Still disorganized and psychotic.  Orders completed for admission to psychiatric ward.

## 2017-10-09 NOTE — ED Notes (Signed)
Patient with discharge orders to be admitted to [facility], all belongings taken with Patient to unit, no signs of distress, Nurse called report to [facility] RN

## 2017-10-09 NOTE — Progress Notes (Signed)
Admission Note:  D:  37 year old white  Female in under the service of  Dr. Jennet Maduro .  Patient  Admitted for Substance  Abuse  Voice of using Meth daily if she can get it . Patient was acting bizarre  Prior to coming to the hospital . Stated she told God she didn't need him  Later went to a church fell out on the cross in front of the church Members came out praying  For her.  Patient  Had  4 kids  Lost 3 of them and her grandmother Has the last one   .  Pt appeared depressed  With  a flat affect.  Pt  denies SI / Pt is redirectable and cooperative with assessment.      A: Pt admitted to unit per protocol, skin assessment and search done with Isaiah Blakes RN and no contraband found Skin assed no beaks  .  Pt  educated on therapeutic milieu rules. Pt was introduced to milieu by nursing staff.    R: Pt was receptive to education about the milieu .  15 min safety checks started. Clinical research associate offered support

## 2017-10-09 NOTE — BHH Suicide Risk Assessment (Signed)
[facility] Admission Suicide Risk Assessment   Nursing information obtained from:  Patient Demographic factors:  Unemployed, Low socioeconomic status, Adolescent or young adult Current Mental Status:  NA Loss Factors:  Loss of significant relationship Historical Factors:  NA Risk Reduction Factors:  Positive social support, Positive therapeutic relationship, Living with another person, especially a relative, Responsible for children under 86 years of age  Total Time spent with patient: 1 hour Principal Problem: Bipolar I disorder, current or most recent episode manic, with psychotic features (HCC) Diagnosis:   Patient Active Problem List   Diagnosis Date Noted  . Bipolar I disorder, current or most recent episode manic, with psychotic features (HCC) [F31.2] 10/09/2017    Priority: High  . 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 Alcohol use disorder, moderate, dependence (HCC) [F10.20] 10/09/2017  . 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 Cannabis use disorder, moderate, dependence (HCC) [F12.20] 10/09/2017  . Intractable pain [R52] 02/08/2016  . Mood disorder (HCC) [F39] 02/08/2016   Subjective Data: psychotic break.  Continued Clinical Symptoms:  Alcohol Use Disorder Identification Test Final Score (AUDIT): 5 The "Alcohol Use Disorders Identification Test", Guidelines for Use in Primary Care, Second Edition.  World Science writer [facility]). Score between 0-7:  no or low risk or alcohol related problems. Score between 8-15:  moderate risk of alcohol related problems. Score between 16-19:  high risk of alcohol related problems. Score 20 or above:  warrants further diagnostic evaluation for alcohol dependence and treatment.   CLINICAL FACTORS:   Severe Anxiety and/or Agitation Bipolar Disorder:   Mixed State Alcohol/Substance Abuse/Dependencies Currently Psychotic   Musculoskeletal: Strength & Muscle Tone: within normal limits Gait & Station:  normal Patient leans: N/A  Psychiatric Specialty Exam: Physical Exam  Nursing note and vitals reviewed. Psychiatric: Her mood appears anxious. Her speech is rapid and/or pressured. She is hyperactive and actively hallucinating. Thought content is paranoid and delusional. Cognition and memory are normal. She expresses impulsivity. She exhibits a depressed mood.    Review of Systems  Cardiovascular: Positive for palpitations.  Neurological: Negative.   Psychiatric/Behavioral: Positive for depression, hallucinations and substance abuse. The patient is nervous/anxious and has insomnia.   All other systems reviewed and are negative.   Blood pressure (!) 144/95, pulse (!) 111, temperature 98.9 F (37.2 C), temperature source Oral, resp. rate 20, height  (1.6 m), weight 55.8 kg (123 lb), SpO2 100 %.Body mass index is 21.79 kg/m.  General Appearance: Casual  Eye Contact:  Good  Speech:  Pressured  Volume:  Normal  Mood:  Anxious  Affect:  Congruent  Thought Process:  Goal Directed and Descriptions of Associations: Intact  Orientation:  Full (Time, Place, and Person)  Thought Content:  Delusions, Hallucinations: Auditory and Paranoid Ideation  Suicidal Thoughts:  No  Homicidal Thoughts:  No  Memory:  Immediate;   Fair Recent;   Fair Remote;   Fair  Judgement:  Poor  Insight:  Lacking  Psychomotor Activity:  Increased  Concentration:  Concentration: Fair and Attention Span: Fair  Recall:  [employer] of Knowledge:  Fair  Language:  Fair  Akathisia:  No  Handed:  Right  AIMS (if indicated):     Assets:  Communication Skills Desire for Improvement Housing Intimacy Physical Health Resilience Social Support  ADL's:  Intact  Cognition:  WNL  Sleep:  Number of Hours: 3.3      COGNITIVE FEATURES THAT CONTRIBUTE TO RISK:  None    SUICIDE RISK:  Moderate:  Frequent suicidal ideation with limited intensity, and duration, some specificity in terms of plans, no associated  intent, good self-control, limited dysphoria/symptomatology, some risk factors present, and identifiable protective factors, including available and accessible social support.  PLAN OF CARE: hospital admission, medication management, substance abuse counseling, discharge planning.  Ms. Giusto is a 37 year old female with a history of depression, mood instability and amphetamine abuse admitted for a psychotic break.  #Mood/psychosis -start Zyprexa 15 mg nightly  #Insomnia -Trazodone 100 mg nightly PRN  #Substance abuse -positive for cannabis and amphetamines, admits to drinking -start Librium taper  #Abnormal EKG -troponin negative -QTc 457  #Labs -lipid panel, TSH and A1C -pregnancy test  #Disposition -discharge to home with family -follow up with [facility]   I certify that inpatient services furnished can reasonably be expected to improve the patient's condition.   Kristine Linea, MD 10/10/2017, 8:25 AM

## 2017-10-09 NOTE — ED Notes (Signed)
Nurse had to reassure Patient that she was not dying or crazy, Patient crying stating that she was afraid she was going to die and go to hell, and that she might be crazy, nurse reassured her that she was in a good place to get help and that she would feel better in the days to come, q 15 minute checks in progress.

## 2017-10-09 NOTE — Plan of Care (Signed)
New admission  patient has not attended programing  Problem: Health Behavior/Discharge Planning: Goal: Ability to identify changes in lifestyle to reduce recurrence of condition will improve Outcome: Not Progressing Goal: Identification of resources available to assist in meeting health care needs will improve Outcome: Not Progressing   Problem: Physical Regulation: Goal: Complications related to the disease process, condition or treatment will be avoided or minimized Outcome: Not Progressing   Problem: Safety: Goal: Ability to remain free from injury will improve Outcome: Not Progressing   Problem: Education: Goal: Knowledge of [place] [facility] Education information/materials will improve Outcome: Not Progressing   Problem: Coping: Goal: Ability to verbalize frustrations and anger appropriately will improve Outcome: Not Progressing

## 2017-10-09 NOTE — Tx Team (Signed)
Initial Treatment Plan 10/09/2017 5:00 PM Kelly Peterson [id number]:[id number]    PATIENT STRESSORS: Financial difficulties Loss of children Substance abuse   PATIENT STRENGTHS: Ability for insight Average or above average intelligence Capable of independent living Communication skills Supportive family/friends   PATIENT IDENTIFIED PROBLEMS: Subtance abuse  10/09/17  Depression 09/2717                   DISCHARGE CRITERIA:  Ability to meet basic life and health needs Improved stabilization in mood, thinking, and/or behavior Withdrawal symptoms are absent or subacute and managed without 24-hour nursing intervention  PRELIMINARY DISCHARGE PLAN: Outpatient therapy Return to previous living arrangement  PATIENT/FAMILY INVOLVEMENT: This treatment plan has been presented to and reviewed with the patient, Kelly Peterson, and/or family member,  .  The patient and family have been given the opportunity to ask questions and make suggestions.  Crist Infante, RN 10/09/2017, 5:00 PM

## 2017-10-10 DIAGNOSIS — F312 Bipolar disorder, current episode manic severe with psychotic features: Principal | ICD-10-CM

## 2017-10-10 LAB — HCG, QUANTITATIVE, PREGNANCY: hCG, Beta Chain, Quant, S: <1 m[IU]/mL (ref <5)

## 2017-10-10 LAB — LIPID PANEL
CHOL/HDL RATIO: 3.8 ratio
Cholesterol: 193 mg/dL (ref 0–200)
HDL: 51 mg/dL (ref >40)
LDL CALC: 129 mg/dL — AB (ref 0–99)
Triglycerides: 65 mg/dL (ref <150)
VLDL: 13 mg/dL (ref 0–40)

## 2017-10-10 LAB — TSH: TSH: 3.822 u[IU]/mL (ref 0.350–4.500)

## 2017-10-10 LAB — TROPONIN I

## 2017-10-10 LAB — HEMOGLOBIN A1C
Hgb A1c MFr Bld: 5.4 % (ref 4.8–5.6)
Mean Plasma Glucose: 108.28 mg/dL

## 2017-10-10 NOTE — Plan of Care (Addendum)
Patient found asleep in bed upon my arrival. Patient is visible in hallways but not social this evening. Patient is extremely anxious and pacing and wringing hands in hallway. Patient spoke on the phone and kept telling the person on the other end "if something happens to me here, just be sure to tell my kids that I love them and I'm sorry for everything I've done." Patient requests to call children and leaves similar voicemail for them. Speech is soft and focused on worry regarding her safety. Reassured patient with reality-based statements. Emotional support provided with small effect. Patient is religiously preoccupied. Offer made for [facility] but patient refused. Provided a Bible as per request but patient disturbed by profanities written in it by previous users. Patient then requests Chaplin services and call placed with request for Bible. Chaplin on his way. Patient requests that MHT on 15 minute checks call her name when coming to her room "to make sure I'm okay." Reassured patient that I would check on her while she slept. Compliant with HS medications but despite Librium, Zyprexa, and Trazodone, patient remains awake. Q 15 minute checks maintained. Will continue to monitor throughout the shift. After Chaplin visit, patient slept 6 hours. No apparent distress. Will endorse care to oncoming shift.  Problem: Elimination: Goal: Will not experience complications related to bowel motility Outcome: Progressing   Problem: Health Behavior/Discharge Planning: Goal: Ability to identify changes in lifestyle to reduce recurrence of condition will improve Outcome: Not Progressing Goal: Identification of resources available to assist in meeting health care needs will improve Outcome: Not Progressing   Problem: Physical Regulation: Goal: Complications related to the disease process, condition or treatment will be avoided or minimized Outcome: Not Progressing   Problem: Education: Goal: Knowledge  of [facility] information/materials will improve Outcome: Not Progressing   Problem: Coping: Goal: Ability to verbalize frustrations and anger appropriately will improve Outcome: Not Progressing   Problem: Spiritual Needs Goal: Ability to function at adequate level Outcome: Not Progressing

## 2017-10-10 NOTE — Plan of Care (Signed)
Pt calm at the beginning of shift. Pt denies SI/HI. Pt did not originally want the trazodone for sleep. Pt up at one point anxious and fidgety, at this time she received the trazodone and a vistaril for anxiousness. Then patient up a little before 11 pm and was saying that she was going to die, and that she needed an EKG. Vitals taken, 98.6, 144/95, 130. Called the doctor, and a EKG was ordered along with a troponin level. Pt also received order for Zyprexa 15 mg and Librium 25 mg. Pt calmed down after the medication, and went to sleep. Will continue to monitor. Problem: Health Behavior/Discharge Planning: Goal: Ability to identify changes in lifestyle to reduce recurrence of condition will improve Outcome: Progressing Goal: Identification of resources available to assist in meeting health care needs will improve Outcome: Progressing   Problem: Safety: Goal: Ability to remain free from injury will improve Outcome: Progressing   Problem: Coping: Goal: Ability to verbalize frustrations and anger appropriately will improve Outcome: Progressing
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
## 2017-10-10 NOTE — Progress Notes (Signed)
[facility] MD Progress Note  10/11/2017 9:20 AM Kelly Peterson  MRN:  [id number]  Subjective:   Ms. Kelly Peterson feels groggy today from medications. She is still hallucinating and afraid that she is about to die. She called her mother very frightened last night. She also felt compelled to call her children who are in [place]. She has not talked to them in 4 years. She admits that she has been using her 35 year old son's Adderall. He is the only child of five who is still with her. Apparently she confessed to her family so they will be better prepared to handle it when she leaves the hospital. She declines residential substance abuse program participation and wants to try SA IOP at [facility] first. She spoke with Unk Pinto from [facility] about it.  Principal Problem: Bipolar I disorder, current or most recent episode manic, with psychotic features (HCC) Diagnosis:   Patient Active Problem List   Diagnosis Date Noted  . Bipolar I disorder, current or most recent episode manic, with psychotic features (HCC) [F31.2] 10/09/2017    Priority: High  . 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 Alcohol use disorder, moderate, dependence (HCC) [F10.20] 10/09/2017  . 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 Cannabis use disorder, moderate, dependence (HCC) [F12.20] 10/09/2017  . Intractable pain [R52] 02/08/2016  . Mood disorder (HCC) [F39] 02/08/2016   Total Time spent with patient: 20 minutes  Past Psychiatric History: mood instability, amphetamine abuse  Past Medical History:  Past Medical History:  Diagnosis Date  . Endometriosis   . GERD (gastroesophageal reflux disease)    NO MEDS  . Mood disorder (HCC) unk  . Stomach ulcer     Past Surgical History:  Procedure Laterality Date  . DILATION AND CURETTAGE OF UTERUS    . ESOPHAGOGASTRODUODENOSCOPY (EGD) WITH PROPOFOL N/A 01/23/2015   Procedure: ESOPHAGOGASTRODUODENOSCOPY (EGD) WITH PROPOFOL;  Surgeon: Christena Deem, MD;  Location: [facility] ENDOSCOPY;  Service: Endoscopy;  Laterality: N/A;   Family History:  Family History  Problem Relation Age of Onset  . Breast cancer Mother   . Bipolar disorder Father    Family Psychiatric  History: none Social History:  Social History   Substance and Sexual Activity  Alcohol Use No     Social History   Substance and Sexual Activity  Drug Use No    Social History   Socioeconomic History  . Marital status: Single    Spouse name: Not on file  . Number of children: Not on file  . Years of education: Not on file  . Highest education level: Not on file  Occupational History  . Not on file  Social Needs  . Financial resource strain: Not on file  . Food insecurity:    Worry: Not on file    Inability: Not on file  . Transportation needs:    Medical: Not on file    Non-medical: Not on file  Tobacco Use  . Smoking status: Never Smoker  . Smokeless tobacco: Never Used  Substance and Sexual Activity  . Alcohol use: No  . Drug use: No  . Sexual activity: Not on file  Lifestyle  . Physical activity:    Days per week: Not on file    Minutes per session: Not on file  . Stress: Not on file  Relationships  . Social connections:    Talks on phone: Not on file    Gets together: Not on file    Attends religious service:  Not on file    Active member of club or organization: Not on file    Attends meetings of clubs or organizations: Not on file    Relationship status: Not on file  Other Topics Concern  . Not on file  Social History Narrative  . Not on file   Additional Social History:                         Sleep: Fair  Appetite:  Fair  Current Medications: Current Facility-Administered Medications  Medication Dose Route Frequency Provider Last Rate Last Dose  . acetaminophen (TYLENOL) tablet 650 mg  650 mg Oral Q6H PRN Clapacs, John T, MD      . alum & mag hydroxide-simeth (MAALOX/MYLANTA) 200-200-20 MG/5ML suspension 30 mL   30 mL Oral Q4H PRN Clapacs, John T, MD      . chlordiazePOXIDE (LIBRIUM) capsule 25 mg  25 mg Oral QID Pucilowska, Jolanta B, MD   25 mg at 10/11/17 0836  . hydrOXYzine (ATARAX/VISTARIL) tablet 50 mg  50 mg Oral TID PRN Clapacs, Jackquline Denmark, MD   50 mg at 10/09/17 2250  . magnesium hydroxide (MILK OF MAGNESIA) suspension 30 mL  30 mL Oral Daily PRN Clapacs, John T, MD      . OLANZapine zydis (ZYPREXA) disintegrating tablet 15 mg  15 mg Oral QHS Pucilowska, Jolanta B, MD   20 mg at 10/10/17 2058  . traZODone (DESYREL) tablet 100 mg  100 mg Oral QHS Clapacs, Jackquline Denmark, MD   100 mg at 10/10/17 2058    Lab Results:  Results for orders placed or performed during the hospital encounter of 10/09/17 (from the past 48 hour(s))  Troponin I (q 6hr x 3)     Status: None   Collection Time: 10/10/17 12:21 AM  Result Value Ref Range   Troponin I <0.03 <0.03 ng/mL    Comment: Performed at [facility], [address] [place], [place] [address]  Hemoglobin A1c     Status: None   Collection Time: 10/10/17  7:10 AM  Result Value Ref Range   Hgb A1c MFr Bld 5.4 4.8 - 5.6 %    Comment: (NOTE) Pre diabetes:          5.7%-6.4% Diabetes:              >6.4% Glycemic control for   <7.0% adults with diabetes    Mean Plasma Glucose 108.28 mg/dL    Comment: Performed at [facility], [address] [place], [place] [address]  Lipid panel     Status: Abnormal   Collection Time: 10/10/17  7:10 AM  Result Value Ref Range   Cholesterol 193 0 - 200 mg/dL   Triglycerides 65 <098 mg/dL   HDL 51 >11 mg/dL   Total CHOL/HDL Ratio 3.8 RATIO   VLDL 13 0 - 40 mg/dL   LDL Cholesterol 914 (H) 0 - 99 mg/dL    Comment:        Total Cholesterol/HDL:CHD Risk Coronary Heart Disease Risk Table                     Men   Women  1/2 Average Risk   3.4   3.3  Average Risk       5.0   4.4  2 X Average Risk   9.6   7.1  3 X Average Risk  23.4   11.0  Use the calculated Patient Ratio above and the CHD Risk  Table to determine the patient's CHD Risk.        ATP III CLASSIFICATION (LDL):  <100     mg/dL   Optimal  161-096  mg/dL   Near or Above                    Optimal  130-159  mg/dL   Borderline  045-409  mg/dL   High  >811     mg/dL   Very High Performed at [facility], [address] [place], [place] [address]   TSH     Status: None   Collection Time: 10/10/17  7:10 AM  Result Value Ref Range   TSH 3.822 0.350 - 4.500 uIU/mL    Comment: Performed by a 3rd Generation assay with a functional sensitivity of <=0.01 uIU/mL. Performed at [facility], [address] [place], [place] [address]   Troponin I (q 6hr x 3)     Status: None   Collection Time: 10/10/17  7:10 AM  Result Value Ref Range   Troponin I <0.03 <0.03 ng/mL    Comment: Performed at [facility], [address] [place], [place] [address]  hCG, quantitative, pregnancy     Status: None   Collection Time: 10/10/17  7:10 AM  Result Value Ref Range   hCG, Beta Chain, Quant, S <1 <5 mIU/mL    Comment:          GEST. AGE      CONC.  (mIU/mL)   <=1 WEEK        5 - 50     2 WEEKS       50 - 500     3 WEEKS       100 - 10,000     4 WEEKS     1,000 - 30,000     5 WEEKS     3,500 - 115,000   6-8 WEEKS     12,000 - 270,000    12 WEEKS     15,000 - 220,000        FEMALE AND NON-PREGNANT FEMALE:     LESS THAN 5 mIU/mL Performed at [facility], [address] [place], [place] [address]     Blood Alcohol level:  Lab Results  Component Value Date   [facility] <10 10/08/2017    Metabolic Disorder Labs: Lab Results  Component Value Date   HGBA1C 5.4 10/10/2017   MPG 108.28 10/10/2017   No results found for: PROLACTIN Lab Results  Component Value Date   CHOL 193 10/10/2017   TRIG 65 10/10/2017   HDL 51 10/10/2017   CHOLHDL 3.8 10/10/2017   VLDL 13 10/10/2017   LDLCALC 129 (H) 10/10/2017    Physical Findings: AIMS: Facial and Oral Movements Muscles of Facial Expression:  None, normal Lips and Perioral Area: None, normal Jaw: None, normal Tongue: None, normal,Extremity Movements Upper (arms, wrists, hands, fingers): None, normal Lower (legs, knees, ankles, toes): None, normal, Trunk Movements Neck, shoulders, hips: None, normal, Overall Severity Severity of abnormal movements (highest score from questions above): None, normal Incapacitation due to abnormal movements: None, normal Patient's awareness of abnormal movements (rate only patient's report): No Awareness, Dental Status Current problems with teeth and/or dentures?: No Does patient usually wear dentures?: No  CIWA:  CIWA-Ar Total: 0 COWS:  COWS Total Score: 9  Musculoskeletal: Strength & Muscle Tone: within normal limits Gait & Station:  normal Patient leans: N/A  Psychiatric Specialty Exam: Physical Exam  Nursing note and vitals reviewed. Psychiatric: Her speech is normal. Her mood appears anxious. She is actively hallucinating. Thought content is paranoid and delusional. Cognition and memory are impaired. She expresses impulsivity. She exhibits a depressed mood.    Review of Systems  Neurological: Negative.   Psychiatric/Behavioral: Positive for depression, hallucinations and substance abuse.  All other systems reviewed and are negative.   Blood pressure 105/69, pulse 97, temperature 98 F (36.7 C), temperature source Oral, resp. rate 16, height  (1.6 m), weight 55.8 kg (123 lb), SpO2 99 %.Body mass index is 21.79 kg/m.  General Appearance: Casual  Eye Contact:  Good  Speech:  Clear and Coherent  Volume:  Normal  Mood:  Anxious and Depressed  Affect:  Tearful  Thought Process:  Goal Directed and Descriptions of Associations: Intact  Orientation:  Full (Time, Place, and Person)  Thought Content:  Delusions, Hallucinations: Auditory and Paranoid Ideation  Suicidal Thoughts:  No  Homicidal Thoughts:  No  Memory:  Immediate;   Fair Recent;   Fair Remote;   Fair  Judgement:  Poor   Insight:  Lacking  Psychomotor Activity:  Increased and Restlessness  Concentration:  Concentration: Fair and Attention Span: Fair  Recall:  [employer] of Knowledge:  Fair  Language:  Fair  Akathisia:  No  Handed:  Right  AIMS (if indicated):     Assets:  Communication Skills Desire for Improvement Housing Intimacy Physical Health Resilience Social Support  ADL's:  Intact  Cognition:  WNL  Sleep:  Number of Hours: 6     Treatment Plan Summary: Daily contact with patient to assess and evaluate symptoms and progress in treatment and Medication management   Ms. Donofrio is a 37 year old female with a history of depression, mood instability and amphetamine abuse admitted for a psychotic break. She is still paranoid and hallucinating.   #Mood/psychosis, slight improvement -continue Zyprexa 15 mg nightly  #Insomnia, slept 6 hours with medications but feels groggy -change Trazodone 100 mg to PRN  #Substance abuse -positive for cannabis and amphetamines, admits to drinking -continue Librium taper -declines residential treatment -open to [facility] IOP  #Abnormal EKG -troponins negative -NSR with QTc of 457  #Labs -lipid panel, TSH and A1C are unremarkable -pregnancy test negative  #Disposition -discharge to home with family -follow up with [facility]    Kristine Linea, MD 10/11/2017, 9:20 AM

## 2017-10-10 NOTE — Progress Notes (Signed)
EKG was done

## 2017-10-10 NOTE — BHH Counselor (Signed)
10/10/2017 1PM  Type of Therapy/Topic:  Group Therapy:  Feelings about Diagnosis  Participation Level:  Active   Description of Group:   This group will allow patients to explore their thoughts and feelings about diagnoses they have received. Patients will be guided to explore their level of understanding and acceptance of these diagnoses. Facilitator will encourage patients to process their thoughts and feelings about the reactions of others to their diagnosis and will guide patients in identifying ways to discuss their diagnosis with significant others in their lives. This group will be process-oriented, with patients participating in exploration of their own experiences, giving and receiving support, and processing challenge from other group members.   Therapeutic Goals: 1. Patient will demonstrate understanding of diagnosis as evidenced by identifying two or more symptoms of the disorder 2. Patient will be able to express two feelings regarding the diagnosis 3. Patient will demonstrate their ability to communicate their needs through discussion and/or role play  Summary of Patient Progress: Actively and appropriately engaged in the group. Patient was able to provide support and validation to other group members.Patient practiced active listening when interacting with the facilitator and other group members. Patient reports "I tend to over exaggerate things. I know that I need help but at times I get overwhelmed with all of the things that I need to get done. I need to learn to communicate better and to be patient with my recovery." Patient is still in the process of obtaining treatment goals.        Therapeutic Modalities:   Cognitive Behavioral Therapy Brief Therapy Feelings Identification    Johny Shears, LCSW 10/10/2017 2:25 PM

## 2017-10-10 NOTE — Progress Notes (Signed)
Recreation Therapy Notes   Date: 10/10/2017  Time: 9:30 am  Location: Craft Room  Behavioral response: Appropriate  Intervention Topic: Coping Skills  Discussion/Intervention:  Group content on today was focused on coping skills. The group defined what coping skills are and when they can be used. Individuals described how they normally cope with things and the coping skills they normally use. Patients expressed why it is important to cope with things and how not coping with things can affect you. The group participated in the intervention "My coping box" and made coping boxes while adding coping skills they could use in the future to the box. Clinical Observations/Feedback:  Patient came to group late due to unknown reasons. Individual participated in the intervention and was social with peers and staff during group. Deirdre Gryder LRT/CTRS          Kelly Peterson 10/10/2017 11:06 AM

## 2017-10-10 NOTE — Progress Notes (Signed)
   10/10/17 2300  Clinical Encounter Type  Visited With Patient  Visit Type Initial;Spiritual support  Referral From Nurse  Spiritual Encounters  Spiritual Needs Prayer;Emotional  CH sat with patient offering spiritual and emotional support along with prayer; patient distraught and concerned that she had lost favor and spoke with chaplain about faith and redemption along with forgiveness.

## 2017-10-10 NOTE — Progress Notes (Signed)
Recreation Therapy Notes  INPATIENT RECREATION THERAPY ASSESSMENT  Patient Details Name: Starlet Gallentine MRN: [id number] DOB: 01/09/1981 Today's Date: 10/10/2017       Information Obtained From: Patient  Able to Participate in Assessment/Interview: Yes  Patient Presentation: Responsive  Reason for Admission (Per Patient): Active Symptoms, Substance Abuse  Patient Stressors: Family, Friends  Coping Skills:   Isolation, Arguments, Substance Abuse, Avoidance, Hot Bath/Shower  Leisure Interests (2+):  Social - Pharmacist, community)  Frequency of Recreation/Participation: Weekly  Awareness of Community Resources:  Yes  Community Resources:  [place], [place], Engineering geologist, [employer]  Current Use: Yes  If no, Barriers?:    Expressed Interest in [employer] Information:    [place] of Residence:  Film/video editor  Patient Main Form of Transportation: Set designer  Patient Strengths:  N/A  Patient Identified Areas of Improvement:  Being a better mom  Patient Goal for Hospitalization:  To stop doing drugs and be a better parent  Current SI (including self-harm):  No  Current HI:  No  Current AVH: No  Staff Intervention Plan: Group Attendance, Collaborate with Interdisciplinary Treatment Team  Consent to Intern Participation: N/A  Shonika Kolasinski 10/10/2017, 11:21 AM

## 2017-10-10 NOTE — Plan of Care (Signed)
Patient states that she slept fair last night with the use of a sleep aid. Appetite is poor and energy level is low. Denies being depressed and states that her anxiety is a 5. Denies SI/HI/AVH and pain that this time. Up ambulating the unit with a steady gait. Milieu remains safe with q 15 minute safety checks.

## 2017-10-10 NOTE — [facility] Suicide Risk Assessment (Signed)
[facility] INPATIENT:  Family/Significant Other Suicide Prevention Education  Suicide Prevention Education:  Education Completed; Henrene Dodge' Reuel Boom [phone],  (name of family member/significant other) has been identified by the patient as the family member/significant other with whom the patient will be residing, and identified as the person(s) who will aid the patient in the event of a mental health crisis (suicidal ideations/suicide attempt).  With written consent from the patient, the family member/significant other has been provided the following suicide prevention education, prior to the and/or following the discharge of the patient.  The suicide prevention education provided includes the following:  Suicide risk factors  Suicide prevention and interventions  National Suicide Hotline telephone number  [facility] assessment telephone number  [facility] Emergency Assistance 911  [facility] and/or Residential Mobile Crisis Unit telephone number  Request made of family/significant other to:  Remove weapons (e.g., guns, rifles, knives), all items previously/currently identified as safety concern.    Remove drugs/medications (over-the-counter, prescriptions, illicit drugs), all items previously/currently identified as a safety concern.  The family member/significant other verbalizes understanding of the suicide prevention education information provided.  The family member/significant other agrees to remove the items of safety concern listed above.   Cleda Daub Lillyan Hitson, LCSW 10/10/2017, 4:46 PM

## 2017-10-11 NOTE — Tx Team (Addendum)
Interdisciplinary Treatment and Diagnostic Plan Update  10/11/2017 Time of Session: 10:40 AM Kelly Peterson MRN: [id number]  Principal Diagnosis: Bipolar I disorder, current or most recent episode manic, with psychotic features (HCC)  Secondary Diagnoses: Principal Problem:   Bipolar I disorder, current or most recent episode manic, with psychotic features (HCC) Active Problems:   Amphetamine use disorder, moderate (HCC)   Alcohol use disorder, moderate, dependence (HCC)   Cannabis use disorder, moderate, dependence (HCC)   Current Medications:  Current Facility-Administered Medications  Medication Dose Route Frequency Provider Last Rate Last Dose  . acetaminophen (TYLENOL) tablet 650 mg  650 mg Oral Q6H PRN Clapacs, John T, MD      . alum & mag hydroxide-simeth (MAALOX/MYLANTA) 200-200-20 MG/5ML suspension 30 mL  30 mL Oral Q4H PRN Clapacs, John T, MD      . chlordiazePOXIDE (LIBRIUM) capsule 25 mg  25 mg Oral QID Pucilowska, Jolanta B, MD   25 mg at 10/11/17 0836  . hydrOXYzine (ATARAX/VISTARIL) tablet 50 mg  50 mg Oral TID PRN Clapacs, Jackquline Denmark, MD   50 mg at 10/11/17 1237  . magnesium hydroxide (MILK OF MAGNESIA) suspension 30 mL  30 mL Oral Daily PRN Clapacs, John T, MD      . OLANZapine zydis (ZYPREXA) disintegrating tablet 15 mg  15 mg Oral QHS Pucilowska, Jolanta B, MD   20 mg at 10/10/17 2058  . traZODone (DESYREL) tablet 100 mg  100 mg Oral QHS Clapacs, John T, MD   100 mg at 10/10/17 2058   PTA Medications: Medications Prior to Admission  Medication Sig Dispense Refill Last Dose  . carbamide peroxide (DEBROX) 6.5 % OTIC solution Place 5 drops into the right ear 2 (two) times daily. 15 mL 2   . citalopram (CELEXA) 40 MG tablet Take 40 mg by mouth daily.     . diazepam (VALIUM) 5 MG tablet Take 1 tablet (5 mg total) by mouth every 8 (eight) hours as needed for muscle spasms. 10 tablet 0 prn at prn  . gabapentin (NEURONTIN) 300 MG capsule Take 300 mg by mouth 3 (three)  times daily.    02/08/2016 at Unknown time  . hydrocortisone (PROCTOCORT) 1 % CREA Apply 1 application topically 3 (three) times daily. 10 g 0   . meloxicam (MOBIC) 15 MG tablet Take 1 tablet (15 mg total) by mouth daily. 30 tablet 0   . methocarbamol (ROBAXIN) 500 MG tablet Take 1 tablet (500 mg total) by mouth 4 (four) times daily. 16 tablet 0   . naproxen (NAPROSYN) 500 MG tablet Take 1 tablet (500 mg total) by mouth 2 (two) times daily with a meal. 20 tablet 00   . tiZANidine (ZANAFLEX) 2 MG tablet Take 1 tablet by mouth 3 (three) times daily as needed.  0   . traZODone (DESYREL) 50 MG tablet Take 50 mg by mouth at bedtime.   02/07/2016 at Unknown time    Patient Stressors: Financial difficulties Loss of children Substance abuse  Patient Strengths: Ability for insight Average or above average intelligence Capable of independent living Communication skills Supportive family/friends  Treatment Modalities: Medication Management, Group therapy, Case management,  1 to 1 session with clinician, Psychoeducation, Recreational therapy.   Physician Treatment Plan for Primary Diagnosis: Bipolar I disorder, current or most recent episode manic, with psychotic features (HCC) Long Term Goal(s): Improvement in symptoms so as ready for discharge Improvement in symptoms so as ready for discharge   Short Term Goals: Ability to identify changes  in lifestyle to reduce recurrence of condition will improve Ability to verbalize feelings will improve Ability to disclose and discuss suicidal ideas Ability to demonstrate self-control will improve Ability to identify and develop effective coping behaviors will improve Ability to maintain clinical measurements within normal limits will improve Compliance with prescribed medications will improve Ability to identify triggers associated with substance abuse/mental health issues will improve Ability to identify changes in lifestyle to reduce recurrence of  condition will improve Ability to demonstrate self-control will improve Ability to identify triggers associated with substance abuse/mental health issues will improve  Medication Management: Evaluate patient's response, side effects, and tolerance of medication regimen.  Therapeutic Interventions: 1 to 1 sessions, Unit Group sessions and Medication administration.  Evaluation of Outcomes: Progressing  Physician Treatment Plan for Secondary Diagnosis: Principal Problem:   Bipolar I disorder, current or most recent episode manic, with psychotic features (HCC) Active Problems:   Amphetamine use disorder, moderate (HCC)   Alcohol use disorder, moderate, dependence (HCC)   Cannabis use disorder, moderate, dependence (HCC)  Long Term Goal(s): Improvement in symptoms so as ready for discharge Improvement in symptoms so as ready for discharge   Short Term Goals: Ability to identify changes in lifestyle to reduce recurrence of condition will improve Ability to verbalize feelings will improve Ability to disclose and discuss suicidal ideas Ability to demonstrate self-control will improve Ability to identify and develop effective coping behaviors will improve Ability to maintain clinical measurements within normal limits will improve Compliance with prescribed medications will improve Ability to identify triggers associated with substance abuse/mental health issues will improve Ability to identify changes in lifestyle to reduce recurrence of condition will improve Ability to demonstrate self-control will improve Ability to identify triggers associated with substance abuse/mental health issues will improve     Medication Management: Evaluate patient's response, side effects, and tolerance of medication regimen.  Therapeutic Interventions: 1 to 1 sessions, Unit Group sessions and Medication administration.  Evaluation of Outcomes: Progressing   RN Treatment Plan for Primary Diagnosis:  Bipolar I disorder, current or most recent episode manic, with psychotic features (HCC) Long Term Goal(s): Knowledge of disease and therapeutic regimen to maintain health will improve  Short Term Goals: Ability to participate in decision making will improve, Ability to identify and develop effective coping behaviors will improve and Compliance with prescribed medications will improve  Medication Management: RN will administer medications as ordered by provider, will assess and evaluate patient's response and provide education to patient for prescribed medication. RN will report any adverse and/or side effects to prescribing provider.  Therapeutic Interventions: 1 on 1 counseling sessions, Psychoeducation, Medication administration, Evaluate responses to treatment, Monitor vital signs and CBGs as ordered, Perform/monitor CIWA, COWS, AIMS and Fall Risk screenings as ordered, Perform wound care treatments as ordered.  Evaluation of Outcomes: Progressing   LCSW Treatment Plan for Primary Diagnosis: Bipolar I disorder, current or most recent episode manic, with psychotic features (HCC) Long Term Goal(s): Safe transition to appropriate next level of care at discharge, Engage patient in therapeutic group addressing interpersonal concerns.  Short Term Goals: Engage patient in aftercare planning with referrals and resources and Increase skills for wellness and recovery  Therapeutic Interventions: Assess for all discharge needs, 1 to 1 time with Social worker, Explore available resources and support systems, Assess for adequacy in community support network, Educate family and significant other(s) on suicide prevention, Complete Psychosocial Assessment, Interpersonal group therapy.  Evaluation of Outcomes: Progressing   Progress in Treatment: Attending groups: Yes. Participating  in groups: Yes. Taking medication as prescribed: Yes. Toleration medication: Yes. Family/Significant other contact made:  Yes, individual(s) contacted:  pt's boyfriend, Amalia Hailey O'Daniel has been contacted Patient understands diagnosis: Yes. Discussing patient identified problems/goals with staff: Yes. Medical problems stabilized or resolved: Yes. Denies suicidal/homicidal ideation: Yes. Issues/concerns per patient self-inventory: No. Other: n/a  New problem(s) identified: No, Describe:  No new problems identified.  New Short Term/Long Term Goal(s):  Patient Goals:  "stop feeling crazy and hearing voices in my head telling me that I'm going to die"  Discharge Plan or Barriers: Tentative plan is for pt to return to her home with follow-up services to include MM and SAIOP with [facility].  Reason for Continuation of Hospitalization: Anxiety Hallucinations Medication stabilization Withdrawal symptoms  Estimated Length of Stay: 3-5 days  Recreational Therapy: Patient Stressors: Family, Friends Patient Goal: Patient will identify 3 triggers for substance use within 5 recreation therapy group sessions  Attendees: Patient: Kelly Peterson 10/11/2017 3:29 PM  Physician: Kristine Linea, MD 10/11/2017 3:29 PM  Nursing: Hulan Amato, RN 10/11/2017 3:29 PM  RN Care Manager: 10/11/2017 3:29 PM  Social Worker: Huey Romans, LCSW 10/11/2017 3:29 PM  Recreational Therapist: Garret Reddish, LRT 10/11/2017 3:29 PM  Other: Johny Shears, LCSWA 10/11/2017 3:29 PM  Other:  10/11/2017 3:29 PM  Other: 10/11/2017 3:29 PM    Scribe for Treatment Team: Alease Frame, LCSW 10/11/2017 3:29 PM

## 2017-10-11 NOTE — Plan of Care (Signed)
Patient has the ability to identify changes in her lifestyle to reduce recurrence of her condition as well as identify the available resources that can assist her in meeting her health related needs. Patient has not experienced any complications related to her condition. Patient has remained free from injury on the unit and has the ability to verbalize her anger and frustrations appropriately. Patient verbalizes understanding of the general information that's been provided to her and all questions/concerns have been addressed and answered. Patient has been functioning at an adequate level and remains safe at this time.  Problem: Health Behavior/Discharge Planning: Goal: Ability to identify changes in lifestyle to reduce recurrence of condition will improve Outcome: Progressing Goal: Identification of resources available to assist in meeting health care needs will improve Outcome: Progressing   Problem: Physical Regulation: Goal: Complications related to the disease process, condition or treatment will be avoided or minimized Outcome: Progressing   Problem: Safety: Goal: Ability to remain free from injury will improve Outcome: Progressing   Problem: Education: Goal: Knowledge of [place] [facility] Education information/materials will improve Outcome: Progressing   Problem: Coping: Goal: Ability to verbalize frustrations and anger appropriately will improve Outcome: Progressing   Problem: Spiritual Needs Goal: Ability to function at adequate level Outcome: Progressing   Problem: Elimination: Goal: Will not experience complications related to bowel motility Outcome: Progressing

## 2017-10-11 NOTE — Progress Notes (Signed)
Recreation Therapy Notes  Date: 10/11/2017  Time: 9:30 am  Location: Craft Room  Behavioral response: Appropriate  Intervention Topic: Problem Solving  Discussion/Intervention:  Group content on today was focused on problem solving. The group described what problem solving is. Patients expressed how problems affects them and how they deal with problems. Individuals identified healthy ways to deal with problems. Patients explained what normally happens to them when they do not deal with problems. The group expressed reoccurring problems for them. The group participated in the intervention "Put the story together" with their peers and worked together to put a story that was broken up in the correct order. Clinical Observations/Feedback:  Patient came to group and stated it is important for her to solve her problems because they build up if she does not. Individual participated in the intervention and was social with peers and staff during group. Mahnoor Mathisen LRT/CTRS         Mayleen Borrero 10/11/2017 12:50 PM

## 2017-10-11 NOTE — [facility] Group Notes (Signed)
LCSW Group Therapy Note  10/11/2017 1:00 pm  Type of Therapy/Topic:  Group Therapy:  Emotion Regulation  Participation Level:  Active   Description of Group:    The purpose of this group is to assist patients in learning to regulate negative emotions and experience positive emotions. Patients will be guided to discuss ways in which they have been vulnerable to their negative emotions. These vulnerabilities will be juxtaposed with experiences of positive emotions or situations, and patients will be challenged to use positive emotions to combat negative ones. Special emphasis will be placed on coping with negative emotions in conflict situations, and patients will process healthy conflict resolution skills.  Therapeutic Goals: 1. Patient will identify two positive emotions or experiences to reflect on in order to balance out negative emotions 2. Patient will label two or more emotions that they find the most difficult to experience 3. Patient will demonstrate positive conflict resolution skills through discussion and/or role plays  Summary of Patient Progress:  Kelly Peterson actively participated in today's group discussion on emotion regulation.  Kelly Peterson shared that the two emotions that she often experiences and finds most difficult are anger and frustration.  Kelly Peterson shared that her conflict resolution skills have not been positive as she often turns to using illicit drugs to help her "cope" with her negative emotions.  Kelly Peterson shared that she using drugs to "have a good time" and feels that she can only have a good time if she is using drugs.  Kelly Peterson shared that she would like to get to an emotional place in which she can spend time with her kids without using drugs and this is a goal that she has established for herself when she discharges from the hospital.     Therapeutic Modalities:   Cognitive Behavioral Therapy Feelings Identification Dialectical Behavioral Therapy

## 2017-10-11 NOTE — Progress Notes (Signed)
D- Patient alert and oriented. Patient presents in a pleasant mood on assessment stating that she slept "good" last night and has complaints of anxiety rating it a "5/10". Patient states to this writer that she's anxious because of "thinking about all the things I have to do". Patient denies SI, HI, AVH, and pain at this time. Patient also denies any signs/symptonms of depression. Patient's stated goal is to "stay in today".   A- Scheduled medications administered to patient, per MD orders. Support and encouragement provided.  Routine safety checks conducted every 15 minutes.  Patient informed to notify staff with problems or concerns.  R- No adverse drug reactions noted. Patient contracts for safety at this time. Patient compliant with medications and treatment plan. Patient receptive, calm, and cooperative. Patient interacts well with others on the unit.  Patient remains safe at this time.

## 2017-10-12 MED ORDER — GABAPENTIN 300 MG PO CAPS
300.0000 mg | ORAL_CAPSULE | Freq: Three times a day (TID) | ORAL | Status: DC
  Administered 2017-10-12 – 2017-10-13 (×2): 300 mg via ORAL
  Filled 2017-10-12 (×2): qty 1

## 2017-10-12 MED ORDER — OLANZAPINE 15 MG PO TABS: 15.0000 mg | ORAL_TABLET | Freq: Every day | ORAL | 1 refills | Status: DC

## 2017-10-12 MED ORDER — HYDROXYZINE HCL 50 MG PO TABS: 50.0000 mg | ORAL_TABLET | Freq: Three times a day (TID) | ORAL | 1 refills | Status: DC | PRN

## 2017-10-12 MED ORDER — TRAZODONE HCL 100 MG PO TABS: 100.0000 mg | ORAL_TABLET | Freq: Every day | ORAL | 1 refills | Status: DC

## 2017-10-12 MED ORDER — OLANZAPINE 5 MG PO TABS
15.0000 mg | ORAL_TABLET | Freq: Every day | ORAL | Status: DC
  Administered 2017-10-12: 15 mg via ORAL
  Filled 2017-10-12: qty 1

## 2017-10-12 MED ORDER — GABAPENTIN 300 MG PO CAPS: 300.0000 mg | ORAL_CAPSULE | Freq: Three times a day (TID) | ORAL | 1 refills | Status: DC

## 2017-10-12 NOTE — Plan of Care (Addendum)
Patient found with visitor upon my arrival. Patient is visible and social this evening. Remains anxious but affect is animated with underlying anxiety. Patient is polite and cooperative. Denies SI/HI/AVH. Reports improved mood and feeling as if she is ready for discharge. Reports eating and voiding adequately. Compliant with HS medications and staff direction. Patient asked a few questions regarding the different look of Zyprexa as she has been receiving Zydis until now. Patient displays some anxiety about this and writer was able to reassure her and educate her with good effect. Q 15 minute checks maintained. Will continue to monitor throughout the shift. Patient slept 6.25 hours. No apparent distress. Will endorse care to oncoming shift.  Problem: Health Behavior/Discharge Planning: Goal: Ability to identify changes in lifestyle to reduce recurrence of condition will improve Outcome: Progressing   Problem: Safety: Goal: Ability to remain free from injury will improve Outcome: Progressing   Problem: Education: Goal: Knowledge of [place] [facility] Education information/materials will improve Outcome: Progressing   Problem: Spiritual Needs Goal: Ability to function at adequate level Outcome: Progressing

## 2017-10-12 NOTE — Progress Notes (Signed)
[facility] MD Progress Note  10/12/2017 2:26 PM Kelly Peterson  MRN:  [id number]  Subjective:    Kelly Peterson no longer is hallucinating or paranoid. She still feels anxious but refused librium due to sedation. She is rather tearful today and misses her son. She is not suicidal or homicidal. Discharge tomorrow.   Principal Problem: Bipolar I disorder, current or most recent episode manic, with psychotic features (HCC) Diagnosis:   Patient Active Problem List   Diagnosis Date Noted  . Bipolar I disorder, current or most recent episode manic, with psychotic features (HCC) [F31.2] 10/09/2017    Priority: High  . 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 Alcohol use disorder, moderate, dependence (HCC) [F10.20] 10/09/2017  . 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 Cannabis use disorder, moderate, dependence (HCC) [F12.20] 10/09/2017  . Intractable pain [R52] 02/08/2016  . Mood disorder (HCC) [F39] 02/08/2016   Total Time spent with patient: 20 minutes  Past Psychiatric History: mood instability, substance abuse.  Past Medical History:  Past Medical History:  Diagnosis Date  . Endometriosis   . GERD (gastroesophageal reflux disease)    NO MEDS  . Mood disorder (HCC) unk  . Stomach ulcer     Past Surgical History:  Procedure Laterality Date  . DILATION AND CURETTAGE OF UTERUS    . ESOPHAGOGASTRODUODENOSCOPY (EGD) WITH PROPOFOL N/A 01/23/2015   Procedure: ESOPHAGOGASTRODUODENOSCOPY (EGD) WITH PROPOFOL;  Surgeon: Christena Deem, MD;  Location: [facility] ENDOSCOPY;  Service: Endoscopy;  Laterality: N/A;   Family History:  Family History  Problem Relation Age of Onset  . Breast cancer Mother   . Bipolar disorder Father    Family Psychiatric  History: none. Social History:  Social History   Substance and Sexual Activity  Alcohol Use No     Social History   Substance and Sexual Activity  Drug Use No    Social History   Socioeconomic History   . Marital status: Single    Spouse name: Not on file  . Number of children: Not on file  . Years of education: Not on file  . Highest education level: Not on file  Occupational History  . Not on file  Social Needs  . Financial resource strain: Not on file  . Food insecurity:    Worry: Not on file    Inability: Not on file  . Transportation needs:    Medical: Not on file    Non-medical: Not on file  Tobacco Use  . Smoking status: Never Smoker  . Smokeless tobacco: Never Used  Substance and Sexual Activity  . Alcohol use: No  . Drug use: No  . Sexual activity: Not on file  Lifestyle  . Physical activity:    Days per week: Not on file    Minutes per session: Not on file  . Stress: Not on file  Relationships  . Social connections:    Talks on phone: Not on file    Gets together: Not on file    Attends religious service: Not on file    Active member of club or organization: Not on file    Attends meetings of clubs or organizations: Not on file    Relationship status: Not on file  Other Topics Concern  . Not on file  Social History Narrative  . Not on file   Additional Social History:  Sleep: Fair  Appetite:  Fair  Current Medications: Current Facility-Administered Medications  Medication Dose Route Frequency Provider Last Rate Last Dose  . acetaminophen (TYLENOL) tablet 650 mg  650 mg Oral Q6H PRN Clapacs, Jackquline Denmark, MD   650 mg at 10/11/17 2133  . alum & mag hydroxide-simeth (MAALOX/MYLANTA) 200-200-20 MG/5ML suspension 30 mL  30 mL Oral Q4H PRN Clapacs, John T, MD      . gabapentin (NEURONTIN) capsule 300 mg  300 mg Oral TID Tayllor Breitenstein B, MD      . hydrOXYzine (ATARAX/VISTARIL) tablet 50 mg  50 mg Oral TID PRN Clapacs, Jackquline Denmark, MD   50 mg at 10/12/17 1200  . magnesium hydroxide (MILK OF MAGNESIA) suspension 30 mL  30 mL Oral Daily PRN Clapacs, John T, MD      . OLANZapine (ZYPREXA) tablet 15 mg  15 mg Oral QHS Yarelli Decelles,  Larin Weissberg B, MD      . traZODone (DESYREL) tablet 100 mg  100 mg Oral QHS Clapacs, John T, MD   100 mg at 10/11/17 2057    Lab Results: No results found for this or any previous visit (from the past 48 hour(s)).  Blood Alcohol level:  Lab Results  Component Value Date   ETH <10 10/08/2017    Metabolic Disorder Labs: Lab Results  Component Value Date   HGBA1C 5.4 10/10/2017   MPG 108.28 10/10/2017   No results found for: PROLACTIN Lab Results  Component Value Date   CHOL 193 10/10/2017   TRIG 65 10/10/2017   HDL 51 10/10/2017   CHOLHDL 3.8 10/10/2017   VLDL 13 10/10/2017   LDLCALC 129 (H) 10/10/2017    Physical Findings: AIMS: Facial and Oral Movements Muscles of Facial Expression: None, normal Lips and Perioral Area: None, normal Jaw: None, normal Tongue: None, normal,Extremity Movements Upper (arms, wrists, hands, fingers): None, normal Lower (legs, knees, ankles, toes): None, normal, Trunk Movements Neck, shoulders, hips: None, normal, Overall Severity Severity of abnormal movements (highest score from questions above): None, normal Incapacitation due to abnormal movements: None, normal Patient's awareness of abnormal movements (rate only patient's report): No Awareness, Dental Status Current problems with teeth and/or dentures?: No Does patient usually wear dentures?: No  CIWA:  CIWA-Ar Total: 0 COWS:  COWS Total Score: 9  Musculoskeletal: Strength & Muscle Tone: within normal limits Gait & Station: normal Patient leans: N/A  Psychiatric Specialty Exam: Physical Exam  Nursing note and vitals reviewed. Psychiatric: Her speech is normal and behavior is normal. Thought content normal. Her mood appears anxious. Her affect is labile. Cognition and memory are normal. She expresses impulsivity.    Review of Systems  Neurological: Negative.   Psychiatric/Behavioral: Positive for substance abuse. The patient is nervous/anxious.   All other systems reviewed and are  negative.   Blood pressure 112/75, pulse 87, temperature 97.6 F (36.4 C), temperature source Oral, resp. rate 18, height  (1.6 m), weight 55.8 kg (123 lb), SpO2 100 %.Body mass index is 21.79 kg/m.  General Appearance: Casual  Eye Contact:  Good  Speech:  Clear and Coherent  Volume:  Normal  Mood:  Anxious  Affect:  Tearful  Thought Process:  Goal Directed and Descriptions of Associations: Intact  Orientation:  Full (Time, Place, and Person)  Thought Content:  WDL  Suicidal Thoughts:  No  Homicidal Thoughts:  No  Memory:  Immediate;   Fair Recent;   Fair Remote;   Fair  Judgement:  Impaired  Insight:  Lacking  Psychomotor Activity:  Normal  Concentration:  Concentration: Fair and Attention Span: Fair  Recall:  [employer] of Knowledge:  Fair  Language:  Fair  Akathisia:  No  Handed:  Right  AIMS (if indicated):     Assets:  Communication Skills Desire for Improvement Housing Intimacy Physical Health Resilience Social Support  ADL's:  Intact  Cognition:  WNL  Sleep:  Number of Hours: 7.5     Treatment Plan Summary: Daily contact with patient to assess and evaluate symptoms and progress in treatment and Medication management   Kelly Peterson is a 37 year old female with a history of depression, mood instability and amphetamine abuse admitted for a psychotic break. She is no longer psychotic or suicidal. Still very anxious abnd tearful Discharge tomorrow. still paranoid and hallucinating.   #Mood/psychosis, resolved -continue Zyprexa 15 mg nightly  #Insomnia, resolved with current medications -change Trazodone 100 mg to PRN  #Anxiety -start Neurontin 300 mg TID -Vistaril 50 mg TID PRN  #Substance abuse -positive for cannabis and amphetamines, admits to drinking -continue Librium taper -declines residential treatment -open to [facility]  #Abnormal EKG -troponins negative -NSR with QTc of 457  #Labs -lipid panel, TSH and A1C are  unremarkable -pregnancy test negative  #Disposition -discharge to home with family -follow up with [facility]      Kristine Linea, MD 10/12/2017, 2:26 PM

## 2017-10-12 NOTE — BHH Suicide Risk Assessment (Signed)
[facility] Discharge Suicide Risk Assessment   Principal Problem: Bipolar I disorder, current or most recent episode manic, with psychotic features [place]) Discharge Diagnoses:  Patient Active Problem List   Diagnosis Date Noted  . Bipolar I disorder, current or most recent episode manic, with psychotic features (HCC) [F31.2] 10/09/2017    Priority: High  . 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 Alcohol use disorder, moderate, dependence (HCC) [F10.20] 10/09/2017  . 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 Cannabis use disorder, moderate, dependence (HCC) [F12.20] 10/09/2017  . Intractable pain [R52] 02/08/2016  . Mood disorder (HCC) [F39] 02/08/2016    Total Time spent with patient: 20 minutes  Musculoskeletal: Strength & Muscle Tone: within normal limits Gait & Station: normal Patient leans: N/A  Psychiatric Specialty Exam: Review of Systems  Neurological: Negative.   Psychiatric/Behavioral: Positive for substance abuse.  All other systems reviewed and are negative.   Blood pressure 111/68, pulse 80, temperature 97.8 F (36.6 C), temperature source Oral, resp. rate 18, height  (1.6 m), weight 55.8 kg (123 lb), SpO2 100 %.Body mass index is 21.79 kg/m.  General Appearance: Casual  Eye Contact::  Good  Speech:  Clear and Coherent409  Volume:  Normal  Mood:  Anxious  Affect:  Appropriate and Constricted  Thought Process:  Goal Directed and Descriptions of Associations: Intact  Orientation:  Full (Time, Place, and Person)  Thought Content:  WDL  Suicidal Thoughts:  No  Homicidal Thoughts:  No  Memory:  Immediate;   Fair Recent;   Fair Remote;   Fair  Judgement:  Impaired  Insight:  Lacking  Psychomotor Activity:  Normal  Concentration:  Fair  Recall:  [employer] of Knowledge:Fair  Language: Fair  Akathisia:  No  Handed:  Right  AIMS (if indicated):     Assets:  Communication Skills Desire for  Improvement Housing Intimacy Physical Health Resilience Social Support  Sleep:  Number of Hours: 6.25  Cognition: WNL  ADL's:  Intact   Mental Status Per Nursing Assessment::   On Admission:  NA  Demographic Factors:  Divorced or widowed, Caucasian and Unemployed  Loss Factors: NA  Historical Factors: Prior suicide attempts, Family history of mental illness or substance abuse and Impulsivity  Risk Reduction Factors:   Responsible for children under 69 years of age, Sense of responsibility to family, Living with another person, especially a relative, Positive social support and Positive therapeutic relationship  Continued Clinical Symptoms:  Bipolar Disorder:   Mixed State Alcohol/Substance Abuse/Dependencies  Cognitive Features That Contribute To Risk:  None    Suicide Risk:  Minimal: No identifiable suicidal ideation.  Patients presenting with no risk factors but with morbid ruminations; may be classified as minimal risk based on the severity of the depressive symptoms  Follow-up Information    [facility] Follow up.   Why:  [id number]  Contact information: [address] [place] [address] [phone]           Plan Of Care/Follow-up recommendations:  Activity:  as tolerated Diet:  low sodium heart healthy Other:  keep follow up appointment  Kristine Linea, MD 10/13/2017, 9:20 AM

## 2017-10-12 NOTE — Plan of Care (Addendum)
Patient found in common area with visitor upon my arrival. Patient is visible and somewhat social this evening. Patient mood and affect is remarkably improved since last night. Continues to report anxiety but at a much more manageable level. Patient was able to sit still and eat snack and watch TV with peers. Patient progress is significant. Patient affect is bright and animated with only moments of anxiety. Patient did not mention her sons this evening. Did not focus on guilt. Less guarded and suspicious. Did not ask if she was safe and thanked Clinical research associate for calling the [facility] last night thus memory is improved. Processing is improved. Denies SI/HI/AVH. Reports eating and voiding adequately. Complains of pain, given Tylenol with positive results. Compliant with HS medications and staff direction. Q 15 minute checks maintained. Will continue to monitor throughout the shift. Patient slept 7.5 hours. No apparent distress. Will endorse care to oncoming shift.  Problem: Health Behavior/Discharge Planning: Goal: Ability to identify changes in lifestyle to reduce recurrence of condition will improve Outcome: Progressing   Problem: Education: Goal: Knowledge of [facility] Education information/materials will improve Outcome: Progressing   Problem: Coping: Goal: Ability to verbalize frustrations and anger appropriately will improve Outcome: Progressing

## 2017-10-12 NOTE — Progress Notes (Signed)
Recreation Therapy Notes          Kelly Peterson 10/12/2017 12:56 PM

## 2017-10-12 NOTE — Plan of Care (Signed)
Data: Patient is very anxious and cooperative to assessment. Patient denies SI/HI/AVH. Patient has completed daily self inventory worksheet. Patient reports high level of anxiety. Patient has a pain rating of 0/10. Patient reports good sleep quality, appetite is good. Patient rates depression "2/10" , feelings of hopelessness "2/10" and anxiety "0/10" on self inventory/ Patients goal for today is "work on triggers and identifying them and practicing at least 3 coping skills."  Action:  Q x 15 minute observation checks were completed for safety. Patient was provided with education on medications. Patient was offered support and encouragement. Patient was given scheduled medications. Patient  was encourage to attend groups, participate in unit activities and continue with plan of care.     Response: Patient is medication compliant. Patient is seen in milieu this shift. Patient has no complaints at this time. Patient is receptive to treatment and safety maintained on unit.    Problem: Safety: Goal: Ability to remain free from injury will improve Outcome: Progressing   Problem: Education: Goal: Knowledge of [facility] Education information/materials will improve Outcome: Progressing   Problem: Spiritual Needs Goal: Ability to function at adequate level Outcome: Progressing   Problem: Safety: Goal: Ability to disclose and discuss suicidal ideas will improve Outcome: Progressing

## 2017-10-12 NOTE — BHH Group Notes (Signed)
LCSW Group Therapy Note 10/12/2017 9:00 AM  Type of Therapy and Topic:  Group Therapy:  Setting Goals  Participation Level:  Did Not Attend  Description of Group: In this process group, patients discussed using strengths to work toward goals and address challenges.  Patients identified two positive things about themselves and one goal they were working on.  Patients were given the opportunity to share openly and support each other's plan for self-empowerment.  The group discussed the value of gratitude and were encouraged to have a daily reflection of positive characteristics or circumstances.  Patients were encouraged to identify a plan to utilize their strengths to work on current challenges and goals.  Therapeutic Goals 1. Patient will verbalize personal strengths/positive qualities and relate how these can assist with achieving desired personal goals 2. Patients will verbalize affirmation of peers plans for personal change and goal setting 3. Patients will explore the value of gratitude and positive focus as related to successful achievement of goals 4. Patients will verbalize a plan for regular reinforcement of personal positive qualities and circumstances.  Summary of Patient Progress:  Kelly Peterson was invited to today's group, but chose not to attend.     Therapeutic Modalities Cognitive Behavioral Therapy Motivational Interviewing    Kelly Peterson, [place] 10/12/2017 3:03 PM

## 2017-10-12 NOTE — BHH Group Notes (Signed)
LCSW Group Therapy Note  10/12/2017 1:00 pm  Type of Therapy/Topic:  Group Therapy:  Balance in Life  Participation Level:  Active  Description of Group:    This group will address the concept of balance and how it feels and looks when one is unbalanced. Patients will be encouraged to process areas in their lives that are out of balance and identify reasons for remaining unbalanced. Facilitators will guide patients in utilizing problem-solving interventions to address and correct the stressor making their life unbalanced. Understanding and applying boundaries will be explored and addressed for obtaining and maintaining a balanced life. Patients will be encouraged to explore ways to assertively make their unbalanced needs known to significant others in their lives, using other group members and facilitator for support and feedback.  Therapeutic Goals: 1. Patient will identify two or more emotions or situations they have that consume much of in their lives. 2. Patient will identify signs/triggers that life has become out of balance:  3. Patient will identify two ways to set boundaries in order to achieve balance in their lives:  4. Patient will demonstrate ability to communicate their needs through discussion and/or role plays  Summary of Patient Progress:  Kelly Peterson actively engaged in today's group discussion on balance in life.  Kelly Peterson shard that she the emotions of guilt and embarrassment often consumes her life.  Kelly Peterson shared that triggers for her being out of balance is often her mother telling her what she should be doing, throwing her past in her face, and not really supporting her.  Kelly Peterson agreed that she will need to set boundaries with her mother as well as others who trigger her which often leads to her using drugs.    Therapeutic Modalities:   Cognitive Behavioral Therapy Solution-Focused Therapy Assertiveness Training  Alease Frame, [place] 10/12/2017 4:30 PM

## 2017-10-12 NOTE — Discharge Summary (Signed)
Physician Discharge Summary Note  Patient:  Kelly Peterson is an 37 y.o., female MRN:  [id number] DOB:  1981-04-15 Patient phone:  [phone] (home)  Patient address:   [address] [place] [address],  Total Time spent with patient: 20 minutes plus 15 min on care coordination and documentation.  Date of Admission:  10/09/2017 Date of Discharge: 10/13/2017  Reason for Admission:  Psychotic break.  History of Present Illness:   Identifying data. Ms. Dunford is a 37 year old female with a history of depression.  Chief complaint. "I woke up this morning and I am not dead!"  History of present illness. Information was obtained from the patient and the chart. The patient was brought to the ER by her grandmother for strange behavior. The patient started hearing the voice of God, praying loudly and became convinced that she is about to die. This was accompanied with feeling of impeding doom and palpitations. She has not been sleeping lately. She has been frightened and tearful in the ER and later on the unit. She reports using amphetamines, mostly Adderall that she gets off the street, "a lot". She then stopped using it "cold [place]". She has also been drinking more than usual but denies drinking problem. She is unable to identify any other stressors. She has a history of depression and mood instability. There is a history of crystal meth use but lately she takes pills.   This morning, the patient is cool and collected. She is definitely glad to be alive. Slept well. Feels better.  Past psychiatric history. Hospitalized at [facility] three times in 2006, 2014 and 2015. She was possibly hospitalized other places but is not a good historian. She only remember Neurontin and Seroquel. Seroquel made her feel weird. One suicide attempt by drug overdose while in relationship problems.   Family psychiatric history. Father with bipolar disorder.    Social history. She has GEDs. She  lives with her boyfriend and her 64 year old son. 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    Principal Problem: Bipolar I disorder, current or most recent episode manic, with psychotic features [facility]) Discharge Diagnoses: Patient Active Problem List   Diagnosis Date Noted  . Bipolar I disorder, current or most recent episode manic, with psychotic features (HCC) [F31.2] 10/09/2017    Priority: High  . 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 Alcohol use disorder, moderate, dependence (HCC) [F10.20] 10/09/2017  . 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 Cannabis use disorder, moderate, dependence (HCC) [F12.20] 10/09/2017  . Intractable pain [R52] 02/08/2016  . Mood disorder (HCC) [F39] 02/08/2016     Past Medical History:  Past Medical History:  Diagnosis Date  . Endometriosis   . GERD (gastroesophageal reflux disease)    NO MEDS  . Mood disorder (HCC) unk  . Stomach ulcer     Past Surgical History:  Procedure Laterality Date  . DILATION AND CURETTAGE OF UTERUS    . ESOPHAGOGASTRODUODENOSCOPY (EGD) WITH PROPOFOL N/A 01/23/2015   Procedure: ESOPHAGOGASTRODUODENOSCOPY (EGD) WITH PROPOFOL;  Surgeon: Christena Deem, MD;  Location: [facility] ENDOSCOPY;  Service: Endoscopy;  Laterality: N/A;   Family History:  Family History  Problem Relation Age of Onset  . Breast cancer Mother   . Bipolar disorder Father     Social History:  Social History   Substance and Sexual Activity  Alcohol Use No     Social History   Substance and Sexual Activity  Drug Use No    Social History   Socioeconomic History  . Marital status: Single    Spouse name: Not on file  . Number of children: Not on file  . Years of education: Not on file  . Highest education level: Not on file  Occupational History  . Not on file  Social Needs  . Financial resource strain: Not on file  . Food insecurity:    Worry: Not on file     Inability: Not on file  . Transportation needs:    Medical: Not on file    Non-medical: Not on file  Tobacco Use  . Smoking status: Never Smoker  . Smokeless tobacco: Never Used  Substance and Sexual Activity  . Alcohol use: No  . Drug use: No  . Sexual activity: Not on file  Lifestyle  . Physical activity:    Days per week: Not on file    Minutes per session: Not on file  . Stress: Not on file  Relationships  . Social connections:    Talks on phone: Not on file    Gets together: Not on file    Attends religious service: Not on file    Active member of club or organization: Not on file    Attends meetings of clubs or organizations: Not on file    Relationship status: Not on file  Other Topics Concern  . Not on file  Social History Narrative  . Not on file    Hospital Course:    Ms. Teed is a 37 year old female with a history of depression, mood instability and amphetamine abuse admitted for a psychotic break.Sheaccepted medications and tolerated them well. At the time of discharge, she is no longer psychotic or suicidal. She will follow up with [facility].   #Mood/psychosis, resolved -continueZyprexa 15 mg nightly  #Insomnia, resolved -continue Trazodone 100 mg nightly  #Anxiety -continue Neurontin 300 mg TID  -Vistaril 50 mg TID PRN  #Substance abuse -positive for cannabis and amphetamines, admits to drinking -declines residential treatment -open to [facility]  #Labs -lipid panel, TSH and [facility] unremarkable EKG, QTc of 457 -pregnancy testnegative  #Disposition -discharge to home with family -follow up with [facility]    Physical Findings: AIMS: Facial and Oral Movements Muscles of Facial Expression: None, normal Lips and Perioral Area: None, normal Jaw: None, normal Tongue: None, normal,Extremity Movements Upper (arms, wrists, hands, fingers): None, normal Lower (legs, knees, ankles, toes): None, normal, Trunk Movements Neck, shoulders, hips: None,  normal, Overall Severity Severity of abnormal movements (highest score from questions above): None, normal Incapacitation due to abnormal movements: None, normal Patient's awareness of abnormal movements (rate only patient's report): No Awareness, Dental Status Current problems with teeth and/or dentures?: No Does patient usually wear dentures?: No  CIWA:  CIWA-Ar Total: 0 COWS:  COWS Total Score: 9  Musculoskeletal: Strength & Muscle Tone: within normal limits Gait & Station: normal Patient leans: N/A  Psychiatric Specialty Exam: Physical Exam  Nursing note and vitals reviewed. Psychiatric: Her speech is normal and behavior is normal. Thought content normal. Her mood appears anxious. Cognition and memory are normal. She expresses impulsivity.    Review of Systems  Neurological: Negative.   Psychiatric/Behavioral: Positive for substance abuse.  All other systems reviewed and are negative.   Blood pressure 111/68, pulse 80, temperature 97.8 F (36.6 C), temperature source Oral, resp. rate 18, height  (1.6 m), weight 55.8 kg (123 lb), SpO2 100 %.Body mass index is 21.79 kg/m.  General Appearance: Casual  Eye Contact:  Good  Speech:  Clear and Coherent  Volume:  Normal  Mood:  Anxious  Affect:  Appropriate  Thought Process:  Goal Directed and Descriptions of Associations: Intact  Orientation:  Full (Time, Place, and Person)  Thought Content:  WDL  Suicidal Thoughts:  No  Homicidal Thoughts:  No  Memory:  Immediate;   Fair Recent;   Fair Remote;   Fair  Judgement:  Impaired  Insight:  Lacking  Psychomotor Activity:  Normal  Concentration:  Concentration: Fair and Attention Span: Fair  Recall:  [employer] of Knowledge:  Fair  Language:  Fair  Akathisia:  No  Handed:  Right  AIMS (if indicated):     Assets:  Communication Skills Desire for Improvement Housing Intimacy Physical Health Resilience Social Support  ADL's:  Intact  Cognition:  WNL  Sleep:  Number  of Hours: 6.25     Have you used any form of tobacco in the last 30 days? (Cigarettes, Smokeless Tobacco, Cigars, and/or Pipes): No  Has this patient used any form of tobacco in the last 30 days? (Cigarettes, Smokeless Tobacco, Cigars, and/or Pipes) Yes, No  Blood Alcohol level:  Lab Results  Component Value Date   ETH <10 10/08/2017    Metabolic Disorder Labs:  Lab Results  Component Value Date   HGBA1C 5.4 10/10/2017   MPG 108.28 10/10/2017   No results found for: PROLACTIN Lab Results  Component Value Date   CHOL 193 10/10/2017   TRIG 65 10/10/2017   HDL 51 10/10/2017   CHOLHDL 3.8 10/10/2017   VLDL 13 10/10/2017   LDLCALC 129 (H) 10/10/2017    See Psychiatric Specialty Exam and Suicide Risk Assessment completed by Attending Physician prior to discharge.  Discharge destination:  Home  Is patient on multiple antipsychotic therapies at discharge:  No   Has Patient had three or more failed trials of antipsychotic monotherapy by history:  No  Recommended Plan for Multiple Antipsychotic Therapies: NA  Discharge Instructions    Diet - low sodium heart healthy   Complete by:  As directed    Increase activity slowly   Complete by:  As directed      Allergies as of 10/13/2017      Reactions   Morphine And Related Itching, Rash      Medication List    STOP taking these medications   carbamide peroxide 6.5 % OTIC solution Commonly known as:  DEBROX   citalopram 40 MG tablet Commonly known as:  CELEXA   diazepam 5 MG tablet Commonly known as:  VALIUM   hydrocortisone 1 % Crea Commonly known as:  PROCTOCORT   meloxicam 15 MG tablet Commonly known as:  MOBIC   methocarbamol 500 MG tablet Commonly known as:  ROBAXIN   naproxen 500 MG tablet Commonly known as:  NAPROSYN   tiZANidine 2 MG tablet Commonly known as:  ZANAFLEX     TAKE these medications     Indication  gabapentin 300 MG capsule Commonly known as:  NEURONTIN Take 1 capsule (300 mg  total) by mouth 3 (three) times daily.  Indication:  Neuropathic Pain   hydrOXYzine 50 MG tablet Commonly known as:  ATARAX/VISTARIL Take 1 tablet (50 mg total) by mouth 3 (three) times daily as needed for anxiety.  Indication:  Feeling Anxious   OLANZapine 15 MG tablet Commonly known as:  ZYPREXA Take 1 tablet (15 mg total) by mouth at bedtime.  Indication:  Manic-Depression  traZODone 100 MG tablet Commonly known as:  DESYREL Take 1 tablet (100 mg total) by mouth at bedtime. What changed:    medication strength  how much to take  Indication:  Trouble Sleeping      Follow-up Information    [facility] Follow up.   Why:  TBD-SAIOP  Contact information: [address] [place] [address] [phone]           Follow-up recommendations:  Activity:  as tolerated Diet:  low sodium heart healthy Other:  keep follow up appointments  Comments:    Signed: Kristine Linea, MD 10/13/2017, 9:15 AM

## 2017-10-13 NOTE — Progress Notes (Signed)
Recreation Therapy Notes  Date: 10/13/2017  Time: 9:30 am  Location: Craft Room  Behavioral response: Appropriate  Intervention Topic:  Leisure  Discussion/Intervention:  Group content today was focused on leisure. The group defined what leisure is and some positive leisure activities they participate in. Individuals identified the difference between good and bad leisure. Participants expressed how they feel after participating in the leisure of their choice. The group discussed how they go about picking a leisure activity and if others are involved in their leisure activities. The patient stated how many leisure activities they too choose from and reasons why it is important to have leisure time. Individuals participated in the intervention "Leisure Jeopardy" where they had a chance to identify new leisure activities as well as benefits of leisure. Clinical Observations/Feedback:  Patient came to group late due to unknown reason. Individual participated in the intervention and was social with peers and staff during group. Nialah Saravia LRT/CTRS         Shataria Crist 10/13/2017 12:12 PM

## 2017-10-13 NOTE — Progress Notes (Signed)
Recreation Therapy Notes  INPATIENT RECREATION TR PLAN  Patient Details Name: Kelly Peterson MRN: [id number] DOB: 1980-05-22 Today's Date: 10/13/2017  Rec Therapy Plan Is patient appropriate for Therapeutic Recreation?: Yes Treatment times per week: at least 3 Estimated Length of Stay: 5-7 days TR Treatment/Interventions: Group participation (Comment)  Discharge Criteria Pt will be discharged from therapy if:: Discharged Treatment plan/goals/alternatives discussed and agreed upon by:: Patient/family  Discharge Summary Short term goals set: Patient will identify 3 triggers for substance use within 5 recreation therapy group sessions Short term goals met: Complete Progress toward goals comments: Groups attended Which groups?: Coping skills, Communication, Leisure education, Other (Comment)(Problem Solving) Reason goals not met: N/A Therapeutic equipment acquired: N/A Reason patient discharged from therapy: Discharge from hospital Pt/family agrees with progress & goals achieved: Yes Date patient discharged from therapy: 10/13/17   Daronte Shostak 10/13/2017, 3:24 PM

## 2018-10-30 ENCOUNTER — Telehealth: Payer: Self-pay | Admitting: Obstetrics & Gynecology

## 2018-10-30 NOTE — Telephone Encounter (Signed)
[facility] referring for Encounter for Gynecological examination.  Voicemail box not set up unable to leave message.

## 2018-10-31 NOTE — Telephone Encounter (Signed)
Voicemail box not set up unable to leave message to have patient call back to schedule

## 2018-11-02 NOTE — Telephone Encounter (Signed)
Called and left voicemail for patient to call back to be schedule. Contacted PCP

## 2019-09-24 ENCOUNTER — Emergency Department
Admission: EM | Admit: 2019-09-24 | Discharge: 2019-09-24 | Disposition: A | Discharge status: Home / Self Care | Payer: Medicaid Other | Attending: Emergency Medicine | Admitting: Emergency Medicine

## 2019-09-24 ENCOUNTER — Other Ambulatory Visit: Payer: Self-pay

## 2019-09-24 DIAGNOSIS — L929 Granulomatous disorder of the skin and subcutaneous tissue, unspecified: Secondary | ICD-10-CM | POA: Insufficient documentation

## 2019-09-24 DIAGNOSIS — Z5189 Encounter for other specified aftercare: Secondary | ICD-10-CM

## 2019-09-24 DIAGNOSIS — Z79899 Other long term (current) drug therapy: Secondary | ICD-10-CM | POA: Diagnosis not present

## 2019-09-24 MED ORDER — SULFAMETHOXAZOLE-TRIMETHOPRIM 800-160 MG PO TABS: 1.0000 | ORAL_TABLET | Freq: Two times a day (BID) | ORAL | 0 refills | Status: AC

## 2019-09-24 NOTE — Discharge Instructions (Signed)
Take Bactrim twice daily for the next 7 days. 

## 2019-09-24 NOTE — ED Provider Notes (Signed)
Emergency Department Provider Note  ____________________________________________  Time seen: Approximately 8:37 PM  I have reviewed the triage vital signs and the nursing notes.   HISTORY  Chief Complaint Wound Check   Historian Patient    HPI Kelly Peterson is a 39 y.o. female presents to the emergency department with granuloma formation surrounding a nose ring.  Patient states that she had her nose pierced approximately 3 weeks ago and recently changed the ring.  She states that she has noticed an external and internal "bump".  She has tried cleansing with saline at home with no improvement in her symptoms.   Past Medical History:  Diagnosis Date  . Endometriosis   . GERD (gastroesophageal reflux disease)    NO MEDS  . Mood disorder (HCC) unk  . Stomach ulcer      Immunizations up to date:  Yes.     Past Medical History:  Diagnosis Date  . Endometriosis   . GERD (gastroesophageal reflux disease)    NO MEDS  . Mood disorder (HCC) unk  . Stomach ulcer     Patient Active Problem List   Diagnosis Date Noted  . Amphetamine use disorder, moderate (HCC) 10/09/2017  . Alcohol use disorder, moderate, dependence (HCC) 10/09/2017  . Benzodiazepine abuse (HCC) 10/09/2017  . Bipolar I disorder, current or most recent episode manic, with psychotic features (HCC) 10/09/2017  . Cannabis use disorder, moderate, dependence (HCC) 10/09/2017  . Intractable pain 02/08/2016  . Mood disorder (HCC) 02/08/2016    Past Surgical History:  Procedure Laterality Date  . DILATION AND CURETTAGE OF UTERUS    . ESOPHAGOGASTRODUODENOSCOPY (EGD) WITH PROPOFOL N/A 01/23/2015   Procedure: ESOPHAGOGASTRODUODENOSCOPY (EGD) WITH PROPOFOL;  Surgeon: Christena Deem, MD;  Location: [facility] ENDOSCOPY;  Service: Endoscopy;  Laterality: N/A;    Prior to Admission medications   Medication Sig Start Date End Date Taking? Authorizing Provider  gabapentin (NEURONTIN) 300 MG capsule Take 1  capsule (300 mg total) by mouth 3 (three) times daily. 10/12/17   Pucilowska, Jolanta B, MD  hydrOXYzine (ATARAX/VISTARIL) 50 MG tablet Take 1 tablet (50 mg total) by mouth 3 (three) times daily as needed for anxiety. 10/12/17   Pucilowska, Jolanta B, MD  OLANZapine (ZYPREXA) 15 MG tablet Take 1 tablet (15 mg total) by mouth at bedtime. 10/12/17   Pucilowska, Jolanta B, MD  sulfamethoxazole-trimethoprim (BACTRIM DS) 800-160 MG tablet Take 1 tablet by mouth 2 (two) times daily for 7 days. 09/24/19 10/01/19  Orvil Feil, PA-C  traZODone (DESYREL) 100 MG tablet Take 1 tablet (100 mg total) by mouth at bedtime. 10/12/17   Pucilowska, Ellin Goodie, MD    Allergies Morphine and related  Family History  Problem Relation Age of Onset  . Breast cancer Mother   . Bipolar disorder Father     Social History Social History   Tobacco Use  . Smoking status: Never Smoker  . Smokeless tobacco: Never Used  Substance Use Topics  . Alcohol use: No  . Drug use: No     Review of Systems  Constitutional: No fever/chills Eyes:  No discharge ENT: Patient has granuloma of left nare surrounding nose ring. Respiratory: no cough. No SOB/ use of accessory muscles to breath Gastrointestinal:   No nausea, no vomiting.  No diarrhea.  No constipation. Musculoskeletal: Negative for musculoskeletal pain. Skin: Negative for rash, abrasions, lacerations, ecchymosis.    ____________________________________________   PHYSICAL EXAM:  VITAL SIGNS: ED Triage Vitals  Enc Vitals Group     BP  09/24/19 1746 (!) 147/99     Pulse Rate 09/24/19 1746 85     Resp 09/24/19 1826 16     Temp 09/24/19 1746 98.3 F (36.8 C)     Temp Source 09/24/19 1746 Oral     SpO2 09/24/19 1746 100 %     Weight 09/24/19 1747 126 lb (57.2 kg)     Height 09/24/19 1747 5\' 4"  (1.626 m)     Head Circumference --      Peak Flow --      Pain Score 09/24/19 1747 6     Pain Loc --      Pain Edu? --      Excl. in [place]? --       Constitutional: Alert and oriented. Well appearing and in no acute distress. Eyes: Conjunctivae are normal. PERRL. EOMI. Head: Atraumatic. ENT:      Ears:       Nose: No congestion/rhinnorhea.  Patient has small granuloma surrounding earring at left nare.      Mouth/Throat: Mucous membranes are moist.  Neck: No stridor.  No cervical spine tenderness to palpation. Cardiovascular: Normal rate, regular rhythm. Normal S1 and S2.  Good peripheral circulation. Respiratory: Normal respiratory effort without tachypnea or retractions. Lungs CTAB. Good air entry to the bases with no decreased or absent breath sounds Gastrointestinal: Bowel sounds x 4 quadrants. Soft and nontender to palpation. No guarding or rigidity. No distention. Musculoskeletal: Full range of motion to all extremities. No obvious deformities noted Neurologic:  Normal for age. No gross focal neurologic deficits are appreciated.  Skin:  Skin is warm, dry and intact. No rash noted. Psychiatric: Mood and affect are normal for age. Speech and behavior are normal.   ____________________________________________   LABS (all labs ordered are listed, but only abnormal results are displayed)  Labs Reviewed - No data to display ____________________________________________  EKG   ____________________________________________  RADIOLOGY   No results found.  ____________________________________________    PROCEDURES  Procedure(s) performed:     Procedures     Medications - No data to display   ____________________________________________   INITIAL IMPRESSION / ASSESSMENT AND PLAN / ED COURSE  Pertinent labs & imaging results that were available during my care of the patient were reviewed by me and considered in my medical decision making (see chart for details).      Assessment and plan Visit for wound check 39 year old female presents to the emergency department with a small granuloma surrounding  left nare.  Patient was advised that granuloma would improve if she removed ring.  Patient would like to try a trial of outpatient antibiotic to see if her symptoms improved.  She was discharged with Bactrim.  Return precautions were given to return with new or worsening symptoms.  All patient questions were answered.   ____________________________________________  FINAL CLINICAL IMPRESSION(S) / ED DIAGNOSES  Final diagnoses:  Visit for wound check      NEW MEDICATIONS STARTED DURING THIS VISIT:  ED Discharge Orders         Ordered    sulfamethoxazole-trimethoprim (BACTRIM DS) 800-160 MG tablet  2 times daily     09/24/19 1821              This chart was dictated using voice recognition software/Dragon. Despite best efforts to proofread, errors can occur which can change the meaning. Any change was purely unintentional.     Lannie Fields, PA-C 09/24/19 2040    Nance Pear, MD 09/24/19 [phone]

## 2019-09-24 NOTE — ED Triage Notes (Signed)
Pt reports that she got her left nare pierced and changed the nose ring too early, pt reports pain at the site with some swelling

## 2019-09-24 NOTE — ED Notes (Signed)
See triage note  Presents with possible infection from nose ring  States she had her nose pierced about 3 weeks ago  States she changed her earring but developed some swelling and pain

## 2020-05-02 ENCOUNTER — Encounter: Payer: Self-pay | Admitting: Emergency Medicine

## 2020-05-02 ENCOUNTER — Other Ambulatory Visit: Payer: Self-pay

## 2020-05-02 ENCOUNTER — Emergency Department
Admission: EM | Admit: 2020-05-02 | Discharge: 2020-05-02 | Disposition: A | Discharge status: Home / Self Care | Payer: Medicaid Other | Attending: Emergency Medicine | Admitting: Emergency Medicine

## 2020-05-02 DIAGNOSIS — L0291 Cutaneous abscess, unspecified: Secondary | ICD-10-CM

## 2020-05-02 DIAGNOSIS — L0201 Cutaneous abscess of face: Secondary | ICD-10-CM | POA: Diagnosis present

## 2020-05-02 MED ORDER — SULFAMETHOXAZOLE-TRIMETHOPRIM 800-160 MG PO TABS: 1.0000 | ORAL_TABLET | Freq: Two times a day (BID) | ORAL | 0 refills | Status: DC

## 2020-05-02 NOTE — ED Notes (Signed)
E signature pad not working 

## 2020-05-02 NOTE — ED Provider Notes (Signed)
[facility] Emergency Department Provider Note  ____________________________________________   Event Date/Time   First MD Initiated Contact with Patient 05/02/20 1503     (approximate)  I have reviewed the triage vital signs and the nursing notes.   HISTORY  Chief Complaint Dental Pain and Abscess (Pt has facial abscess)    HPI Kelly Peterson is a 39 y.o. female presents emergency department complaining of a abscess to the right cheek for several days.  Patient states that she was able to pop the area and get lots of pus out but it continues to run.  She states that the areas become larger.  No fever or chills.  No tooth pain.    Past Medical History:  Diagnosis Date  . Endometriosis   . GERD (gastroesophageal reflux disease)    NO MEDS  . Mood disorder (HCC) unk  . Stomach ulcer     Patient Active Problem List   Diagnosis Date Noted  . Amphetamine use disorder, moderate (HCC) 10/09/2017  . Alcohol use disorder, moderate, dependence (HCC) 10/09/2017  . Benzodiazepine abuse (HCC) 10/09/2017  . Bipolar I disorder, current or most recent episode manic, with psychotic features (HCC) 10/09/2017  . Cannabis use disorder, moderate, dependence (HCC) 10/09/2017  . Intractable pain 02/08/2016  . Mood disorder (HCC) 02/08/2016    Past Surgical History:  Procedure Laterality Date  . DILATION AND CURETTAGE OF UTERUS    . ESOPHAGOGASTRODUODENOSCOPY (EGD) WITH PROPOFOL N/A 01/23/2015   Procedure: ESOPHAGOGASTRODUODENOSCOPY (EGD) WITH PROPOFOL;  Surgeon: Christena Deem, MD;  Location: [facility] ENDOSCOPY;  Service: Endoscopy;  Laterality: N/A;    Prior to Admission medications   Medication Sig Start Date End Date Taking? Authorizing Provider  sulfamethoxazole-trimethoprim (BACTRIM DS) 800-160 MG tablet Take 1 tablet by mouth 2 (two) times daily. 05/02/20   Jissel Slavens, Roselyn Bering, PA-C  gabapentin (NEURONTIN) 300 MG capsule Take 1 capsule (300 mg total) by  mouth 3 (three) times daily. 10/12/17 05/02/20  Pucilowska, Ellin Goodie, MD  OLANZapine (ZYPREXA) 15 MG tablet Take 1 tablet (15 mg total) by mouth at bedtime. 10/12/17 05/02/20  Pucilowska, Ellin Goodie, MD  traZODone (DESYREL) 100 MG tablet Take 1 tablet (100 mg total) by mouth at bedtime. 10/12/17 05/02/20  Pucilowska, Ellin Goodie, MD    Allergies Morphine and related  Family History  Problem Relation Age of Onset  . Breast cancer Mother   . Bipolar disorder Father     Social History Social History   Tobacco Use  . Smoking status: Never Smoker  . Smokeless tobacco: Never Used  Substance Use Topics  . Alcohol use: No  . Drug use: No    Review of Systems  Constitutional: No fever/chills Eyes: No visual changes. ENT: No sore throat. Respiratory: Denies cough Cardiovascular: Denies chest pain Gastrointestinal: Denies abdominal pain Genitourinary: Negative for dysuria. Musculoskeletal: Negative for back pain. Skin: Negative for rash.  Positive abscess to the right cheek Psychiatric: no mood changes,     ____________________________________________   PHYSICAL EXAM:  VITAL SIGNS: ED Triage Vitals  Enc Vitals Group     BP 05/02/20 1216 124/71     Pulse Rate 05/02/20 1216 88     Resp 05/02/20 1216 20     Temp 05/02/20 1216 98.6 F (37 C)     Temp Source 05/02/20 1216 Oral     SpO2 05/02/20 1216 98 %     Weight 05/02/20 1216 120 lb (54.4 kg)     Height 05/02/20 1216 5'  3" (1.6 m)     Head Circumference --      Peak Flow --      Pain Score 05/02/20 1213 5     Pain Loc --      Pain Edu? --      Excl. in GC? --     Constitutional: Alert and oriented. Well appearing and in no acute distress. Eyes: Conjunctivae are normal.  Head: Atraumatic.  Positive swelling around the abscess that is above the mandible and adjacent to the right lower lip, pustule is present Nose: No congestion/rhinnorhea. Mouth/Throat: Mucous membranes are moist.   Neck:  supple no lymphadenopathy  noted Cardiovascular: Normal rate, regular rhythm. Heart sounds are normal Respiratory: Normal respiratory effort.  No retractions, lungs c t a  GU: deferred Musculoskeletal: FROM all extremities, warm and well perfused Neurologic:  Normal speech and language.  Skin:  Skin is warm, dry and intact. No rash noted.  Positive abscess Psychiatric: Mood and affect are normal. Speech and behavior are normal.  ____________________________________________   LABS (all labs ordered are listed, but only abnormal results are displayed)  Labs Reviewed - No data to display ____________________________________________   ____________________________________________  RADIOLOGY    ____________________________________________   PROCEDURES  Procedure(s) performed: No  Procedures    ____________________________________________   INITIAL IMPRESSION / ASSESSMENT AND PLAN / ED COURSE  Pertinent labs & imaging results that were available during my care of the patient were reviewed by me and considered in my medical decision making (see chart for details).   Patient is 39 year old female presents abscess to the face.  See HPI.  Physical exam is consistent with same.  Patient was given prescription for Septra DS 1 twice daily for 7 days.  She is to apply warm compress.  Return emergency department worsening.  She is discharged stable condition.     Kelly Peterson was evaluated in Emergency Department on 05/02/2020 for the symptoms described in the history of present illness. She was evaluated in the context of the global COVID-19 pandemic, which necessitated consideration that the patient might be at risk for infection with the SARS-CoV-2 virus that causes COVID-19. Institutional protocols and algorithms that pertain to the evaluation of patients at risk for COVID-19 are in a state of rapid change based on information released by regulatory bodies including the CDC and federal and state  organizations. These policies and algorithms were followed during the patient's care in the ED.    As part of my medical decision making, I reviewed the following data within the electronic MEDICAL RECORD NUMBER Nursing notes reviewed and incorporated, Old chart reviewed, Notes from prior ED visits and [place] Controlled Substance Database  ____________________________________________   FINAL CLINICAL IMPRESSION(S) / ED DIAGNOSES  Final diagnoses:  Abscess      NEW MEDICATIONS STARTED DURING THIS VISIT:  Discharge Medication List as of 05/02/2020  3:09 PM    START taking these medications   Details  sulfamethoxazole-trimethoprim (BACTRIM DS) 800-160 MG tablet Take 1 tablet by mouth 2 (two) times daily., Starting Sat 05/02/2020, Normal         Note:  This document was prepared using Dragon voice recognition software and may include unintentional dictation errors.    Faythe Ghee, PA-C 05/02/20 1801    Shaune Pollack, MD 05/03/20 [phone]

## 2020-05-02 NOTE — Discharge Instructions (Addendum)
I am warm wet compress to the affected area. Take antibiotic as prescribed. Return if worsening.

## 2020-05-02 NOTE — ED Triage Notes (Signed)
Pt reports toothache to right lower jaw for the past week. No injury

## 2020-05-02 NOTE — ED Notes (Signed)
Patient declined discharge vital signs. 

## 2020-07-15 ENCOUNTER — Emergency Department: Payer: Medicaid Other

## 2020-07-15 ENCOUNTER — Emergency Department
Admission: EM | Admit: 2020-07-15 | Discharge: 2020-07-15 | Disposition: A | Discharge status: Home / Self Care | Payer: Medicaid Other | Attending: Student in an Organized Health Care Education/Training Program | Admitting: Student in an Organized Health Care Education/Training Program

## 2020-07-15 ENCOUNTER — Other Ambulatory Visit: Payer: Self-pay

## 2020-07-15 DIAGNOSIS — R102 Pelvic and perineal pain: Secondary | ICD-10-CM

## 2020-07-15 DIAGNOSIS — N730 Acute parametritis and pelvic cellulitis: Secondary | ICD-10-CM | POA: Insufficient documentation

## 2020-07-15 DIAGNOSIS — R1030 Lower abdominal pain, unspecified: Secondary | ICD-10-CM | POA: Diagnosis present

## 2020-07-15 LAB — WET PREP, GENITAL
Sperm: NONE SEEN
Trich, Wet Prep: NONE SEEN
Yeast Wet Prep HPF POC: NONE SEEN

## 2020-07-15 LAB — CHLAMYDIA/NGC RT PCR (ARMC ONLY)
Chlamydia Tr: NOT DETECTED
N gonorrhoeae: DETECTED — AB

## 2020-07-15 LAB — COMPREHENSIVE METABOLIC PANEL
ALT: 16 U/L (ref 0–44)
AST: 15 U/L (ref 15–41)
Albumin: 3.7 g/dL (ref 3.5–5.0)
Alkaline Phosphatase: 58 U/L (ref 38–126)
Anion gap: 8 (ref 5–15)
BUN: 6 mg/dL (ref 6–20)
CO2: 27 mmol/L (ref 22–32)
Calcium: 8.9 mg/dL (ref 8.9–10.3)
Chloride: 102 mmol/L (ref 98–111)
Creatinine, Ser: 0.59 mg/dL (ref 0.44–1.00)
GFR, Estimated: >60 mL/min (ref >60)
Glucose, Bld: 113 mg/dL — ABNORMAL HIGH (ref 70–99)
Potassium: 4.6 mmol/L (ref 3.5–5.1)
Sodium: 137 mmol/L (ref 135–145)
Total Bilirubin: 0.6 mg/dL (ref 0.3–1.2)
Total Protein: 7.2 g/dL (ref 6.5–8.1)

## 2020-07-15 LAB — CBC
HCT: 36.9 % (ref 36.0–46.0)
Hemoglobin: 12.4 g/dL (ref 12.0–15.0)
MCH: 28.6 pg (ref 26.0–34.0)
MCHC: 33.6 g/dL (ref 30.0–36.0)
MCV: 85.2 fL (ref 80.0–100.0)
Platelets: 249 10*3/uL (ref 150–400)
RBC: 4.33 MIL/uL (ref 3.87–5.11)
RDW: 12.4 % (ref 11.5–15.5)
WBC: 15.7 10*3/uL — ABNORMAL HIGH (ref 4.0–10.5)
nRBC: 0 % (ref 0.0–0.2)

## 2020-07-15 LAB — URINALYSIS, COMPLETE (UACMP) WITH MICROSCOPIC
Bacteria, UA: NONE SEEN
Bilirubin Urine: NEGATIVE
Glucose, UA: NEGATIVE mg/dL
Hgb urine dipstick: NEGATIVE
Ketones, ur: NEGATIVE mg/dL
Leukocytes,Ua: NEGATIVE
Nitrite: NEGATIVE
Protein, ur: NEGATIVE mg/dL
Specific Gravity, Urine: 1.024 (ref 1.005–1.030)
pH: 5 (ref 5.0–8.0)

## 2020-07-15 LAB — POC URINE PREG, ED
Preg Test, Ur: NEGATIVE
Preg Test, Ur: NEGATIVE

## 2020-07-15 LAB — LIPASE, BLOOD: Lipase: 27 U/L (ref 11–51)

## 2020-07-15 MED ORDER — METRONIDAZOLE IN NACL 5-0.79 MG/ML-% IV SOLN
500.0000 mg | Freq: Once | INTRAVENOUS | Status: AC
  Administered 2020-07-15: 500 mg via INTRAVENOUS
  Filled 2020-07-15: qty 100

## 2020-07-15 MED ORDER — KETOROLAC TROMETHAMINE 30 MG/ML IJ SOLN
15.0000 mg | Freq: Once | INTRAMUSCULAR | Status: AC
  Administered 2020-07-15: 15 mg via INTRAVENOUS
  Filled 2020-07-15: qty 1

## 2020-07-15 MED ORDER — HYDROMORPHONE HCL 1 MG/ML IJ SOLN: 0.5000 mg | INTRAMUSCULAR | Status: DC | PRN

## 2020-07-15 MED ORDER — OXYCODONE-ACETAMINOPHEN 5-325 MG PO TABS: 1.0000 | ORAL_TABLET | ORAL | 0 refills | Status: AC | PRN

## 2020-07-15 MED ORDER — SODIUM CHLORIDE 0.9 % IV BOLUS
500.0000 mL | Freq: Once | INTRAVENOUS | Status: AC
  Administered 2020-07-15: 500 mL via INTRAVENOUS

## 2020-07-15 MED ORDER — DOXYCYCLINE HYCLATE 100 MG PO TABS
100.0000 mg | ORAL_TABLET | Freq: Once | ORAL | Status: AC
  Administered 2020-07-15: 100 mg via ORAL
  Filled 2020-07-15: qty 1

## 2020-07-15 MED ORDER — IOHEXOL 300 MG/ML  SOLN
75.0000 mL | Freq: Once | INTRAMUSCULAR | Status: AC | PRN
  Administered 2020-07-15: 75 mL via INTRAVENOUS
  Filled 2020-07-15: qty 75

## 2020-07-15 MED ORDER — DOXYCYCLINE HYCLATE 100 MG PO TABS: 100.0000 mg | ORAL_TABLET | Freq: Two times a day (BID) | ORAL | 0 refills | Status: AC

## 2020-07-15 MED ORDER — METRONIDAZOLE 500 MG PO TABS: 500.0000 mg | ORAL_TABLET | Freq: Two times a day (BID) | ORAL | 0 refills | Status: AC

## 2020-07-15 MED ORDER — ONDANSETRON 4 MG PO TBDP: 4.0000 mg | ORAL_TABLET | Freq: Three times a day (TID) | ORAL | 0 refills | Status: AC | PRN

## 2020-07-15 MED ORDER — SODIUM CHLORIDE 0.9 % IV SOLN
1.0000 g | Freq: Once | INTRAVENOUS | Status: AC
  Administered 2020-07-15: 1 g via INTRAVENOUS
  Filled 2020-07-15: qty 10

## 2020-07-15 MED ORDER — ONDANSETRON HCL 4 MG/2ML IJ SOLN
4.0000 mg | Freq: Once | INTRAMUSCULAR | Status: AC
  Administered 2020-07-15: 4 mg via INTRAVENOUS
  Filled 2020-07-15: qty 2

## 2020-07-15 NOTE — ED Provider Notes (Signed)
[facility] Emergency Department Provider Note    Event Date/Time   First MD Initiated Contact with Patient 07/15/20 1024     (approximate)  I have reviewed the triage vital signs and the nursing notes.   HISTORY  Chief Complaint Abdominal Pain and Vaginal Bleeding    HPI Kelly Peterson is a 39 y.o. female below listed past medical history presents to the ER for evaluation of worsening lower abdominal pain associated with vaginal discharge and bleeding. States the pain goes through to her rectum. Is never had pain like this before. Felt she was passing blood clots. Felt like she might of been having some chills. Does have a history of endometriosis. She is sexually active with one partner. Denies any dysuria.    Past Medical History:  Diagnosis Date  . Endometriosis   . GERD (gastroesophageal reflux disease)    NO MEDS  . Mood disorder (HCC) unk  . Stomach ulcer    Family History  Problem Relation Age of Onset  . Breast cancer Mother   . Bipolar disorder Father    Past Surgical History:  Procedure Laterality Date  . DILATION AND CURETTAGE OF UTERUS    . ESOPHAGOGASTRODUODENOSCOPY (EGD) WITH PROPOFOL N/A 01/23/2015   Procedure: ESOPHAGOGASTRODUODENOSCOPY (EGD) WITH PROPOFOL;  Surgeon: Christena Deem, MD;  Location: [facility] ENDOSCOPY;  Service: Endoscopy;  Laterality: N/A;   Patient Active Problem List   Diagnosis Date Noted  . Amphetamine use disorder, moderate (HCC) 10/09/2017  . Alcohol use disorder, moderate, dependence (HCC) 10/09/2017  . Benzodiazepine abuse (HCC) 10/09/2017  . Bipolar I disorder, current or most recent episode manic, with psychotic features (HCC) 10/09/2017  . Cannabis use disorder, moderate, dependence (HCC) 10/09/2017  . Intractable pain 02/08/2016  . Mood disorder (HCC) 02/08/2016      Prior to Admission medications   Medication Sig Start Date End Date Taking? Authorizing Provider  doxycycline (VIBRA-TABS)  100 MG tablet Take 1 tablet (100 mg total) by mouth 2 (two) times daily for 14 days. 07/15/20 07/29/20 Yes Willy Eddy, MD  metroNIDAZOLE (FLAGYL) 500 MG tablet Take 1 tablet (500 mg total) by mouth 2 (two) times daily for 14 days. 07/15/20 07/29/20 Yes Willy Eddy, MD  ondansetron (ZOFRAN ODT) 4 MG disintegrating tablet Take 1 tablet (4 mg total) by mouth every 8 (eight) hours as needed for nausea or vomiting. 07/15/20  Yes Willy Eddy, MD  oxyCODONE-acetaminophen (PERCOCET) 5-325 MG tablet Take 1 tablet by mouth every 4 (four) hours as needed for severe pain. 07/15/20 07/15/21 Yes Willy Eddy, MD  sulfamethoxazole-trimethoprim (BACTRIM DS) 800-160 MG tablet Take 1 tablet by mouth 2 (two) times daily. 05/02/20   Fisher, Roselyn Bering, PA-C  gabapentin (NEURONTIN) 300 MG capsule Take 1 capsule (300 mg total) by mouth 3 (three) times daily. 10/12/17 05/02/20  Pucilowska, Ellin Goodie, MD  OLANZapine (ZYPREXA) 15 MG tablet Take 1 tablet (15 mg total) by mouth at bedtime. 10/12/17 05/02/20  Pucilowska, Ellin Goodie, MD  traZODone (DESYREL) 100 MG tablet Take 1 tablet (100 mg total) by mouth at bedtime. 10/12/17 05/02/20  Shari Prows, MD    Allergies Morphine and related    Social History Social History   Tobacco Use  . Smoking status: Never Smoker  . Smokeless tobacco: Never Used  Substance Use Topics  . Alcohol use: No  . Drug use: No    Review of Systems Patient denies headaches, rhinorrhea, blurry vision, numbness, shortness of breath, chest pain, edema, cough, abdominal  pain, nausea, vomiting, diarrhea, dysuria, fevers, rashes or hallucinations unless otherwise stated above in HPI. ____________________________________________   PHYSICAL EXAM:  VITAL SIGNS: Vitals:   07/15/20 1002 07/15/20 1334  BP: 130/80 122/70  Pulse: 98 86  Resp: 16 18  Temp: 98.5 F (36.9 C)   SpO2: 100% 99%    Constitutional: Alert and oriented.  Eyes: Conjunctivae are normal.  Head:  Atraumatic. Nose: No congestion/rhinnorhea. Mouth/Throat: Mucous membranes are moist.   Neck: No stridor. Painless ROM.  Cardiovascular: Normal rate, regular rhythm. Grossly normal heart sounds.  Good peripheral circulation. Respiratory: Normal respiratory effort.  No retractions. Lungs CTAB. Gastrointestinal: Soft and nontender. No distention. No abdominal bruits. No CVA tenderness. Genitourinary: Manson PasseyBrown appearing discharge in the vaginal vault. No bleeding. Somewhat difficult to visualize cervix due to the discharge. She is tender on exam throughout. Musculoskeletal: No lower extremity tenderness nor edema.  No joint effusions. Neurologic:  Normal speech and language. No gross focal neurologic deficits are appreciated. No facial droop Skin:  Skin is warm, dry and intact. No rash noted. Psychiatric: Mood and affect are normal. Speech and behavior are normal.  ____________________________________________   LABS (all labs ordered are listed, but only abnormal results are displayed)  Results for orders placed or performed during the hospital encounter of 07/15/20 (from the past 24 hour(s))  Lipase, blood     Status: None   Collection Time: 07/15/20  9:59 AM  Result Value Ref Range   Lipase 27 11 - 51 U/L  Comprehensive metabolic panel     Status: Abnormal   Collection Time: 07/15/20  9:59 AM  Result Value Ref Range   Sodium 137 135 - 145 mmol/L   Potassium 4.6 3.5 - 5.1 mmol/L   Chloride 102 98 - 111 mmol/L   CO2 27 22 - 32 mmol/L   Glucose, Bld 113 (H) 70 - 99 mg/dL   BUN 6 6 - 20 mg/dL   Creatinine, Ser 4.090.59 0.44 - 1.00 mg/dL   Calcium 8.9 8.9 - 81.110.3 mg/dL   Total Protein 7.2 6.5 - 8.1 g/dL   Albumin 3.7 3.5 - 5.0 g/dL   AST 15 15 - 41 U/L   ALT 16 0 - 44 U/L   Alkaline Phosphatase 58 38 - 126 U/L   Total Bilirubin 0.6 0.3 - 1.2 mg/dL   GFR, Estimated >91>60 >47>60 mL/min   Anion gap 8 5 - 15  CBC     Status: Abnormal   Collection Time: 07/15/20  9:59 AM  Result Value Ref Range    WBC 15.7 (H) 4.0 - 10.5 K/uL   RBC 4.33 3.87 - 5.11 MIL/uL   Hemoglobin 12.4 12.0 - 15.0 g/dL   HCT 82.936.9 56.236.0 - 13.046.0 %   MCV 85.2 80.0 - 100.0 fL   MCH 28.6 26.0 - 34.0 pg   MCHC 33.6 30.0 - 36.0 g/dL   RDW 86.512.4 78.411.5 - 69.615.5 %   Platelets 249 150 - 400 K/uL   nRBC 0.0 0.0 - 0.2 %  Urinalysis, Complete w Microscopic Urine, Clean Catch     Status: Abnormal   Collection Time: 07/15/20  9:59 AM  Result Value Ref Range   Color, Urine YELLOW (A) YELLOW   APPearance HAZY (A) CLEAR   Specific Gravity, Urine 1.024 1.005 - 1.030   pH 5.0 5.0 - 8.0   Glucose, UA NEGATIVE NEGATIVE mg/dL   Hgb urine dipstick NEGATIVE NEGATIVE   Bilirubin Urine NEGATIVE NEGATIVE   Ketones, ur NEGATIVE NEGATIVE mg/dL  Protein, ur NEGATIVE NEGATIVE mg/dL   Nitrite NEGATIVE NEGATIVE   Leukocytes,Ua NEGATIVE NEGATIVE   RBC / HPF 0-5 0 - 5 RBC/hpf   WBC, UA 11-20 0 - 5 WBC/hpf   Bacteria, UA NONE SEEN NONE SEEN   Squamous Epithelial / LPF 6-10 0 - 5   Mucus PRESENT   POC urine preg, ED (not at [facility])     Status: None   Collection Time: 07/15/20 10:33 AM  Result Value Ref Range   Preg Test, Ur NEGATIVE NEGATIVE  POC urine preg, ED     Status: None   Collection Time: 07/15/20 11:06 AM  Result Value Ref Range   Preg Test, Ur NEGATIVE NEGATIVE  Wet prep, genital     Status: Abnormal   Collection Time: 07/15/20 11:28 AM  Result Value Ref Range   Yeast Wet Prep HPF POC NONE SEEN NONE SEEN   Trich, Wet Prep NONE SEEN NONE SEEN   Clue Cells Wet Prep HPF POC PRESENT (A) NONE SEEN   WBC, Wet Prep HPF POC MANY (A) NONE SEEN   Sperm NONE SEEN   Chlamydia/NGC rt PCR ([facility] only)     Status: Abnormal   Collection Time: 07/15/20 11:28 AM  Result Value Ref Range   Specimen source GC/Chlam ENDOCERVICAL    Chlamydia Tr NOT DETECTED NOT DETECTED   N gonorrhoeae DETECTED (A) NOT DETECTED   ____________________________________________ ____________________________________________  RADIOLOGY  I personally reviewed all  radiographic images ordered to evaluate for the above acute complaints and reviewed radiology reports and findings.  These findings were personally discussed with the patient.  Please see medical record for radiology report.  ____________________________________________   PROCEDURES  Procedure(s) performed:  Procedures    Critical Care performed: no ____________________________________________   INITIAL IMPRESSION / ASSESSMENT AND PLAN / ED COURSE  Pertinent labs & imaging results that were available during my care of the patient were reviewed by me and considered in my medical decision making (see chart for details).   DDX: pid, mass, fistula, endometritis, toa, diverticulitis  Monifah Freehling is a 40 y.o. who presents to the ED with presentation as described above.  Exam presentation concerning for PID possible TOA.  Will start antibiotics.  Given the discharge also concern for possible fistulization will order CT imaging to further evaluate.  Will give pain medication IV antibiotics.  Clinical Course as of 07/15/20 1459  Wed Jul 15, 2020  1242 CT imaging does show evidence of hydrosalpinx presentation concerning for PID especially with a white count will give antibiotics.  Will order ultrasound to further evaluate. [PR]  1457 Patient and ultrasound shows evidence of probable PID and pyosalpinx but no evidence of TOA.  She is tolerating p.o.  Pain is controlled.  I do believe she is appropriate for trial of outpatient management.  Discussed case with Dr. Valentino Saxon of OB/GYN who is agreeable with outpatient follow-up next week.  Patient demonstrates understanding and agreeable with plan.  Discussed strict return precautions. [PR]    Clinical Course User Index [PR] Willy Eddy, MD    The patient was evaluated in Emergency Department today for the symptoms described in the history of present illness. He/she was evaluated in the context of the global COVID-19 pandemic, which  necessitated consideration that the patient might be at risk for infection with the SARS-CoV-2 virus that causes COVID-19. Institutional protocols and algorithms that pertain to the evaluation of patients at risk for COVID-19 are in a state of rapid change based on  information released by regulatory bodies including the CDC and federal and state organizations. These policies and algorithms were followed during the patient's care in the ED.  As part of my medical decision making, I reviewed the following data within the electronic MEDICAL RECORD NUMBER Nursing notes reviewed and incorporated, Labs reviewed, notes from prior ED visits and [place] Controlled Substance Database   ____________________________________________   FINAL CLINICAL IMPRESSION(S) / ED DIAGNOSES  Final diagnoses:  PID (acute pelvic inflammatory disease)      NEW MEDICATIONS STARTED DURING THIS VISIT:  New Prescriptions   DOXYCYCLINE (VIBRA-TABS) 100 MG TABLET    Take 1 tablet (100 mg total) by mouth 2 (two) times daily for 14 days.   METRONIDAZOLE (FLAGYL) 500 MG TABLET    Take 1 tablet (500 mg total) by mouth 2 (two) times daily for 14 days.   ONDANSETRON (ZOFRAN ODT) 4 MG DISINTEGRATING TABLET    Take 1 tablet (4 mg total) by mouth every 8 (eight) hours as needed for nausea or vomiting.   OXYCODONE-ACETAMINOPHEN (PERCOCET) 5-325 MG TABLET    Take 1 tablet by mouth every 4 (four) hours as needed for severe pain.     Note:  This document was prepared using Dragon voice recognition software and may include unintentional dictation errors.    Willy Eddy, MD 07/15/20 1459

## 2020-07-15 NOTE — ED Triage Notes (Signed)
Pt comes via POV from home with c/o abdominal pain, nausea and vaginal bleeding. Pt states cold chills and possible fever.  Pt states it is intense cramping and pain in lower abdomen.  Pt states when she went to bathroom yesterday she noticed dark black blood and clots.

## 2020-07-15 NOTE — Discharge Instructions (Signed)

## 2020-07-15 NOTE — ED Notes (Signed)
Pain lower abdomen started day before yesterday.  Increases with movement.  To room via wheelchair and stiffly got to bed.  Says had cramp when urinating, but no burning.  Says it goes through to back.  Says she had pain with bowel movement,b ut was able to go.

## 2020-07-15 NOTE — ED Notes (Signed)
abd tender on palpation in lower abdoment.  Says her lmp was different, one day and a little heavier.

## 2020-07-16 ENCOUNTER — Telehealth: Payer: Self-pay | Admitting: Emergency Medicine

## 2020-07-16 NOTE — Telephone Encounter (Signed)
Called patient to inform of positive gonorrhea test and need for partner treatment.  No answer and voicemail is full.

## 2020-07-17 NOTE — Telephone Encounter (Signed)
Called patient to inform of std results.  No answer and voicemail is full. Does not have mychart.  Will send letter.

## 2020-07-24 ENCOUNTER — Telehealth: Payer: Self-pay | Admitting: Emergency Medicine

## 2020-07-24 NOTE — Telephone Encounter (Signed)
Patient left message on my VM  in response to the letter.  I called her back, but there was no answer.

## 2020-08-18 ENCOUNTER — Ambulatory Visit: Payer: Self-pay

## 2020-09-02 ENCOUNTER — Other Ambulatory Visit: Payer: Self-pay

## 2020-09-02 ENCOUNTER — Encounter: Payer: Self-pay | Admitting: Emergency Medicine

## 2020-09-02 ENCOUNTER — Emergency Department
Admission: EM | Admit: 2020-09-02 | Discharge: 2020-09-02 | Disposition: A | Discharge status: Home / Self Care | Payer: Medicaid Other | Attending: Emergency Medicine | Admitting: Emergency Medicine

## 2020-09-02 DIAGNOSIS — R5383 Other fatigue: Secondary | ICD-10-CM | POA: Insufficient documentation

## 2020-09-02 DIAGNOSIS — N898 Other specified noninflammatory disorders of vagina: Secondary | ICD-10-CM | POA: Diagnosis not present

## 2020-09-02 DIAGNOSIS — Z113 Encounter for screening for infections with a predominantly sexual mode of transmission: Secondary | ICD-10-CM | POA: Diagnosis not present

## 2020-09-02 DIAGNOSIS — J029 Acute pharyngitis, unspecified: Secondary | ICD-10-CM | POA: Diagnosis not present

## 2020-09-02 DIAGNOSIS — Z7689 Persons encountering health services in other specified circumstances: Secondary | ICD-10-CM

## 2020-09-02 LAB — URINALYSIS, COMPLETE (UACMP) WITH MICROSCOPIC
Bilirubin Urine: NEGATIVE
Glucose, UA: NEGATIVE mg/dL
Hgb urine dipstick: NEGATIVE
Ketones, ur: NEGATIVE mg/dL
Leukocytes,Ua: NEGATIVE
Nitrite: NEGATIVE
Protein, ur: NEGATIVE mg/dL
Specific Gravity, Urine: 1.015 (ref 1.005–1.030)
pH: 7 (ref 5.0–8.0)

## 2020-09-02 LAB — WET PREP, GENITAL
Clue Cells Wet Prep HPF POC: NONE SEEN
Sperm: NONE SEEN
Trich, Wet Prep: NONE SEEN
Yeast Wet Prep HPF POC: NONE SEEN

## 2020-09-02 LAB — CHLAMYDIA/NGC RT PCR (ARMC ONLY)
Chlamydia Tr: NOT DETECTED
N gonorrhoeae: NOT DETECTED

## 2020-09-02 MED ORDER — AZITHROMYCIN 500 MG PO TABS
1000.0000 mg | ORAL_TABLET | Freq: Once | ORAL | Status: AC
  Administered 2020-09-02: 1000 mg via ORAL
  Filled 2020-09-02: qty 2

## 2020-09-02 MED ORDER — CEFTRIAXONE SODIUM 1 G IJ SOLR
500.0000 mg | Freq: Once | INTRAMUSCULAR | Status: AC
  Administered 2020-09-02: 500 mg via INTRAMUSCULAR
  Filled 2020-09-02: qty 10

## 2020-09-02 NOTE — Discharge Instructions (Addendum)
Follow-up with your gynecologist if any continued problems or you may make an appointment with the [facility] department.  Today you have been treated for both gonorrhea and chlamydia.  No Sexual intercourse for 1 week.

## 2020-09-02 NOTE — ED Notes (Signed)
All documentation completed by Dorian James reviewed and approved by this RN 

## 2020-09-02 NOTE — ED Provider Notes (Signed)
[facility] Emergency Department Provider Note   ____________________________________________   Event Date/Time   First MD Initiated Contact with Patient 09/02/20 1119     (approximate)  I have reviewed the triage vital signs and the nursing notes.   HISTORY  Chief Complaint No chief complaint on file.   HPI Kelly Peterson is a 40 y.o. female presents to the ED with complaint of sore throat, fatigue, and vaginal discharge.  Patient states that she was recently treated for gonorrhea and was under the impression that her partner had completed his treatment.  She reports that they had intercourse yesterday and she began having some discomfort.  She then found out that her partner had not even started with his treatment.  She is now here with concerns of getting reinfected.  Patient was treated for gonorrhea and also bacterial vaginosis.  Partner is also in the ED for evaluation as well.         Past Medical History:  Diagnosis Date  . Endometriosis   . GERD (gastroesophageal reflux disease)    NO MEDS  . Mood disorder (HCC) unk  . Stomach ulcer     Patient Active Problem List   Diagnosis Date Noted  . Amphetamine use disorder, moderate (HCC) 10/09/2017  . Alcohol use disorder, moderate, dependence (HCC) 10/09/2017  . Benzodiazepine abuse (HCC) 10/09/2017  . Bipolar I disorder, current or most recent episode manic, with psychotic features (HCC) 10/09/2017  . Cannabis use disorder, moderate, dependence (HCC) 10/09/2017  . Intractable pain 02/08/2016  . Mood disorder (HCC) 02/08/2016    Past Surgical History:  Procedure Laterality Date  . DILATION AND CURETTAGE OF UTERUS    . ESOPHAGOGASTRODUODENOSCOPY (EGD) WITH PROPOFOL N/A 01/23/2015   Procedure: ESOPHAGOGASTRODUODENOSCOPY (EGD) WITH PROPOFOL;  Surgeon: Christena Deem, MD;  Location: [facility] ENDOSCOPY;  Service: Endoscopy;  Laterality: N/A;    Prior to Admission medications   Medication  Sig Start Date End Date Taking? Authorizing Provider  ondansetron (ZOFRAN ODT) 4 MG disintegrating tablet Take 1 tablet (4 mg total) by mouth every 8 (eight) hours as needed for nausea or vomiting. 07/15/20   Willy Eddy, MD  oxyCODONE-acetaminophen (PERCOCET) 5-325 MG tablet Take 1 tablet by mouth every 4 (four) hours as needed for severe pain. 07/15/20 07/15/21  Willy Eddy, MD  sulfamethoxazole-trimethoprim (BACTRIM DS) 800-160 MG tablet Take 1 tablet by mouth 2 (two) times daily. 05/02/20   Fisher, Roselyn Bering, PA-C  gabapentin (NEURONTIN) 300 MG capsule Take 1 capsule (300 mg total) by mouth 3 (three) times daily. 10/12/17 05/02/20  Pucilowska, Ellin Goodie, MD  OLANZapine (ZYPREXA) 15 MG tablet Take 1 tablet (15 mg total) by mouth at bedtime. 10/12/17 05/02/20  Pucilowska, Ellin Goodie, MD  traZODone (DESYREL) 100 MG tablet Take 1 tablet (100 mg total) by mouth at bedtime. 10/12/17 05/02/20  Pucilowska, Ellin Goodie, MD    Allergies Morphine and related  Family History  Problem Relation Age of Onset  . Breast cancer Mother   . Bipolar disorder Father     Social History Social History   Tobacco Use  . Smoking status: Never Smoker  . Smokeless tobacco: Never Used  Substance Use Topics  . Alcohol use: No  . Drug use: No    Review of Systems Constitutional: No fever/chills Eyes: No visual changes. ENT: Positive sore throat. Cardiovascular: Denies chest pain. Respiratory: Denies shortness of breath. Gastrointestinal: No abdominal pain.  No nausea, no vomiting.  Genitourinary: Negative for dysuria.  Positive for  vaginal discharge. Musculoskeletal: Negative for muscle aches. Skin: Negative for rash. Neurological: Negative for headaches, focal weakness or numbness.  ____________________________________________   PHYSICAL EXAM:  VITAL SIGNS: ED Triage Vitals  Enc Vitals Group     BP 09/02/20 1105 129/82     Pulse Rate 09/02/20 1105 85     Resp 09/02/20 1105 18     Temp 09/02/20  1105 97.7 F (36.5 C)     Temp Source 09/02/20 1105 Oral     SpO2 09/02/20 1105 100 %     Weight 09/02/20 1128 116 lb (52.6 kg)     Height 09/02/20 1128 5\' 3"  (1.6 m)     Head Circumference --      Peak Flow --      Pain Score 09/02/20 1127 6     Pain Loc --      Pain Edu? --      Excl. in GC? --     Constitutional: Alert and oriented. Well appearing and in no acute distress. Eyes: Conjunctivae are normal. PERRL. EOMI. Head: Atraumatic. Nose: No congestion/rhinnorhea. Mouth/Throat: Mucous membranes are moist.  Oropharynx non-erythematous. Neck: No stridor.   Cardiovascular: Normal rate, regular rhythm. Grossly normal heart sounds.  Good peripheral circulation. Respiratory: Normal respiratory effort.  No retractions. Lungs CTAB. Gastrointestinal: Soft and nontender. No distention.  Genitourinary: Sternal genitalia is unremarkable.  There is some white exudate along the vaginal walls.  No active discharge from cervix.  No adnexal masses or tenderness is appreciated.  No chandelier sign.  No cervical motion tenderness per patient. Musculoskeletal: Moves upper and lower extremities without any difficulty.  No edema noted lower extremities. Neurologic:  Normal speech and language. No gross focal neurologic deficits are appreciated. No gait instability. Skin:  Skin is warm, dry and intact. No rash noted. Psychiatric: Mood and affect are normal. Speech and behavior are normal.  ____________________________________________   LABS (all labs ordered are listed, but only abnormal results are displayed)  Labs Reviewed  WET PREP, GENITAL - Abnormal; Notable for the following components:      Result Value   WBC, Wet Prep HPF POC FEW (*)    All other components within normal limits  URINALYSIS, COMPLETE (UACMP) WITH MICROSCOPIC - Abnormal; Notable for the following components:   Color, Urine YELLOW (*)    APPearance CLEAR (*)    Bacteria, UA RARE (*)    All other components within normal  limits  CHLAMYDIA/NGC RT PCR ([facility] ONLY)    PROCEDURES  Procedure(s) performed (including Critical Care):  Procedures   ____________________________________________   INITIAL IMPRESSION / ASSESSMENT AND PLAN / ED COURSE  As part of my medical decision making, I reviewed the following data within the electronic MEDICAL RECORD NUMBER Notes from prior ED visits and [place] Controlled Substance Database  40 year old female presents to the ED with concerns of being reinfected.  Patient states that she was treated for gonorrhea and bacterial vaginosis.  Completed medications and was on the impression that her partner also took his medication but found out later after having sex yesterday that he had not had treatment.  Wet prep showed few WBCs and urinalysis was negative.  GC and Chlamydia are still pending however with patient's history she was treated with Rocephin and azithromycin while in the ED.  She is to follow-up with her oncologist or she was given information about the [facility] department should she prefer to go there.  ____________________________________________   FINAL CLINICAL IMPRESSION(S) / ED DIAGNOSES  Final diagnoses:  Encounter for assessment of STD exposure     ED Discharge Orders    None      *Please note:  Kelly Peterson was evaluated in Emergency Department on 09/02/2020 for the symptoms described in the history of present illness. She was evaluated in the context of the global COVID-19 pandemic, which necessitated consideration that the patient might be at risk for infection with the SARS-CoV-2 virus that causes COVID-19. Institutional protocols and algorithms that pertain to the evaluation of patients at risk for COVID-19 are in a state of rapid change based on information released by regulatory bodies including the CDC and federal and state organizations. These policies and algorithms were followed during the patient's care in the ED.  Some  ED evaluations and interventions may be delayed as a result of limited staffing during and the pandemic.*   Note:  This document was prepared using Dragon voice recognition software and may include unintentional dictation errors.    Tommi Rumps, PA-C 09/02/20 1403    Minna Antis, MD 09/02/20 1715

## 2020-09-02 NOTE — ED Triage Notes (Signed)
Pt via POV with C/O cramping, sore throat, fatigue, and intermittent blurred vision on 4/18 that brought her to the ED today. Pt does report finishing gonorrhea treatment on 4/16 but was sexually active with partner who had not completed treatment yesterday (4/19).

## 2020-12-05 ENCOUNTER — Other Ambulatory Visit: Payer: Self-pay

## 2020-12-05 ENCOUNTER — Encounter: Payer: Self-pay | Admitting: Emergency Medicine

## 2020-12-05 ENCOUNTER — Emergency Department
Admission: EM | Admit: 2020-12-05 | Discharge: 2020-12-05 | Disposition: A | Discharge status: Home / Self Care | Payer: Medicaid Other | Attending: Emergency Medicine | Admitting: Emergency Medicine

## 2020-12-05 DIAGNOSIS — B001 Herpesviral vesicular dermatitis: Secondary | ICD-10-CM | POA: Diagnosis present

## 2020-12-05 MED ORDER — VALACYCLOVIR HCL 500 MG PO TABS: 500.0000 mg | ORAL_TABLET | Freq: Two times a day (BID) | ORAL | 0 refills | Status: AC

## 2020-12-05 NOTE — ED Triage Notes (Signed)
Pt reports had a cold sore come up on her lip, she tried to pop it and now it is swollen

## 2020-12-05 NOTE — ED Provider Notes (Signed)
[facility] Emergency Department Provider Note  ____________________________________________  Time seen: Approximately 6:43 PM  I have reviewed the triage vital signs and the nursing notes.   HISTORY  Chief Complaint Mouth Lesions    HPI Kelly Peterson is a 40 y.o. female who presents the emergency department complaining of a cold sore to the left upper lip.  She was concerned as she had had some swelling around the cold sore and presents for evaluation.  She states its painful.  She has no other URI or viral illness symptoms.  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  She has had cold sores in the past but not as painful or as enlarged.  Patient has no fevers or chills, sores inside the mouth.  Patient has a history as described below with no complaints of chronic medical issues.       Past Medical History:  Diagnosis Date   Endometriosis    GERD (gastroesophageal reflux disease)    NO MEDS   Mood disorder (HCC) unk   Stomach ulcer     Patient Active Problem List   Diagnosis Date Noted   Amphetamine use disorder, moderate (HCC) 10/09/2017   Alcohol use disorder, moderate, dependence (HCC) 10/09/2017   Benzodiazepine abuse (HCC) 10/09/2017   Bipolar I disorder, current or most recent episode manic, with psychotic features (HCC) 10/09/2017   Cannabis use disorder, moderate, dependence (HCC) 10/09/2017   Intractable pain 02/08/2016   Mood disorder (HCC) 02/08/2016    Past Surgical History:  Procedure Laterality Date   DILATION AND CURETTAGE OF UTERUS     ESOPHAGOGASTRODUODENOSCOPY (EGD) WITH PROPOFOL N/A 01/23/2015   Procedure: ESOPHAGOGASTRODUODENOSCOPY (EGD) WITH PROPOFOL;  Surgeon: Christena Deem, MD;  Location: [facility] ENDOSCOPY;  Service: Endoscopy;  Laterality: N/A;    Prior to Admission medications   Medication Sig Start Date End Date Taking? Authorizing Provider  valACYclovir (VALTREX) 500 MG  tablet Take 1 tablet (500 mg total) by mouth 2 (two) times daily for 3 days. 12/05/20 12/08/20 Yes Starkisha Tullis, Delorise Royals, PA-C  ondansetron (ZOFRAN ODT) 4 MG disintegrating tablet Take 1 tablet (4 mg total) by mouth every 8 (eight) hours as needed for nausea or vomiting. 07/15/20   Willy Eddy, MD  oxyCODONE-acetaminophen (PERCOCET) 5-325 MG tablet Take 1 tablet by mouth every 4 (four) hours as needed for severe pain. 07/15/20 07/15/21  Willy Eddy, MD  sulfamethoxazole-trimethoprim (BACTRIM DS) 800-160 MG tablet Take 1 tablet by mouth 2 (two) times daily. 05/02/20   Fisher, Roselyn Bering, PA-C  gabapentin (NEURONTIN) 300 MG capsule Take 1 capsule (300 mg total) by mouth 3 (three) times daily. 10/12/17 05/02/20  Pucilowska, Ellin Goodie, MD  OLANZapine (ZYPREXA) 15 MG tablet Take 1 tablet (15 mg total) by mouth at bedtime. 10/12/17 05/02/20  Pucilowska, Ellin Goodie, MD  traZODone (DESYREL) 100 MG tablet Take 1 tablet (100 mg total) by mouth at bedtime. 10/12/17 05/02/20  Pucilowska, Ellin Goodie, MD    Allergies Morphine and related  Family History  Problem Relation Age of Onset   Breast cancer Mother    Bipolar disorder Father     Social History Social History   Tobacco Use   Smoking status: Never   Smokeless tobacco: Never  Substance Use Topics   Alcohol use: No   Drug use: No     Review of Systems  Constitutional: No fever/chills Eyes: No visual changes. No discharge ENT: No upper respiratory complaints.  Cold  sore to the left upper lip Cardiovascular: no chest pain. Respiratory: no cough. No SOB. Gastrointestinal: No abdominal pain.  No nausea, no vomiting.  Musculoskeletal: Negative for musculoskeletal pain. Skin: Negative for rash, abrasions, lacerations, ecchymosis. Neurological: Negative for headaches, focal weakness or numbness.  10 System ROS otherwise negative.  ____________________________________________   PHYSICAL EXAM:  VITAL SIGNS: ED Triage Vitals [12/05/20 1445]   Enc Vitals Group     BP (!) 126/101     Pulse Rate 88     Resp 20     Temp 98.7 F (37.1 C)     Temp Source Oral     SpO2 100 %     Weight 116 lb 13.5 oz (53 kg)     Height 5\' 4"  (1.626 m)     Head Circumference      Peak Flow      Pain Score      Pain Loc      Pain Edu?      Excl. in GC?      Constitutional: Alert and oriented. Well appearing and in no acute distress. Eyes: Conjunctivae are normal. PERRL. EOMI. Head: Atraumatic. ENT:      Ears:       Nose: No congestion/rhinnorhea.      Mouth/Throat: Mucous membranes are moist.  Visualization of the oropharynx and lips reveals cold sore to the left upper lip.  Patient has mild edema around the cold sore but there is no angioedema.  Airway is patent. Neck: No stridor.    Cardiovascular: Normal rate, regular rhythm. Normal S1 and S2.  Good peripheral circulation. Respiratory: Normal respiratory effort without tachypnea or retractions. Lungs CTAB. Good air entry to the bases with no decreased or absent breath sounds. Musculoskeletal: Full range of motion to all extremities. No gross deformities appreciated. Neurologic:  Normal speech and language. No gross focal neurologic deficits are appreciated.  Skin:  Skin is warm, dry and intact. No rash noted. Psychiatric: Mood and affect are normal. Speech and behavior are normal. Patient exhibits appropriate insight and judgement.   ____________________________________________   LABS (all labs ordered are listed, but only abnormal results are displayed)  Labs Reviewed - No data to display ____________________________________________  EKG   ____________________________________________  RADIOLOGY   No results found.  ____________________________________________    PROCEDURES  Procedure(s) performed:    Procedures    Medications - No data to display   ____________________________________________   INITIAL IMPRESSION / ASSESSMENT AND PLAN / ED  COURSE  Pertinent labs & imaging results that were available during my care of the patient were reviewed by me and considered in my medical decision making (see chart for details).  Review of the [place] CSRS was performed in accordance of the [facility] prior to dispensing any controlled drugs.           Patient's diagnosis is consistent with cold sore.  Patient presented to the emergency department with painful lesion to the left upper lip consistent with a cold sore.  No recent illnesses.  Patient has been under increased stress.  She does have a history of having cold sores in the past.  Patient will be placed on 3 days of antiviral medication.  Orajel at home for symptom relief.  Follow-up with primary care as needed.  Return precautions discussed with patient.  No further work-up at this time..  Patient is given ED precautions to return to the ED for any worsening or new symptoms.     ____________________________________________  FINAL CLINICAL IMPRESSION(S) / ED DIAGNOSES  Final diagnoses:  Cold sore      NEW MEDICATIONS STARTED DURING THIS VISIT:  ED Discharge Orders          Ordered    valACYclovir (VALTREX) 500 MG tablet  2 times daily        12/05/20 1851                This chart was dictated using voice recognition software/Dragon. Despite best efforts to proofread, errors can occur which can change the meaning. Any change was purely unintentional.    Racheal Patches, PA-C 12/05/20 1853    Phineas Semen, MD 12/05/20 [phone]

## 2020-12-28 ENCOUNTER — Ambulatory Visit: Payer: Medicaid Other

## 2021-04-21 ENCOUNTER — Other Ambulatory Visit: Payer: Self-pay

## 2021-04-21 ENCOUNTER — Emergency Department
Admission: EM | Admit: 2021-04-21 | Discharge: 2021-04-21 | Disposition: A | Discharge status: Home / Self Care | Payer: Medicaid Other | Attending: Emergency Medicine | Admitting: Emergency Medicine

## 2021-04-21 DIAGNOSIS — Z5321 Procedure and treatment not carried out due to patient leaving prior to being seen by health care provider: Secondary | ICD-10-CM | POA: Diagnosis not present

## 2021-04-21 DIAGNOSIS — R1032 Left lower quadrant pain: Secondary | ICD-10-CM | POA: Diagnosis present

## 2021-04-21 LAB — COMPREHENSIVE METABOLIC PANEL
ALT: 21 U/L (ref 0–44)
AST: 21 U/L (ref 15–41)
Albumin: 3.8 g/dL (ref 3.5–5.0)
Alkaline Phosphatase: 68 U/L (ref 38–126)
Anion gap: 6 (ref 5–15)
BUN: 9 mg/dL (ref 6–20)
CO2: 27 mmol/L (ref 22–32)
Calcium: 8.7 mg/dL — ABNORMAL LOW (ref 8.9–10.3)
Chloride: 102 mmol/L (ref 98–111)
Creatinine, Ser: 0.56 mg/dL (ref 0.44–1.00)
GFR, Estimated: >60 mL/min (ref >60)
Glucose, Bld: 85 mg/dL (ref 70–99)
Potassium: 3.6 mmol/L (ref 3.5–5.1)
Sodium: 135 mmol/L (ref 135–145)
Total Bilirubin: 0.3 mg/dL (ref 0.3–1.2)
Total Protein: 6.9 g/dL (ref 6.5–8.1)

## 2021-04-21 LAB — URINALYSIS, ROUTINE W REFLEX MICROSCOPIC
Bilirubin Urine: NEGATIVE
Glucose, UA: NEGATIVE mg/dL
Hgb urine dipstick: NEGATIVE
Ketones, ur: NEGATIVE mg/dL
Leukocytes,Ua: NEGATIVE
Nitrite: NEGATIVE
Protein, ur: NEGATIVE mg/dL
Specific Gravity, Urine: 1.025 (ref 1.005–1.030)
pH: 5.5 (ref 5.0–8.0)

## 2021-04-21 LAB — CBC
HCT: 39.2 % (ref 36.0–46.0)
Hemoglobin: 12.9 g/dL (ref 12.0–15.0)
MCH: 27.7 pg (ref 26.0–34.0)
MCHC: 32.9 g/dL (ref 30.0–36.0)
MCV: 84.3 fL (ref 80.0–100.0)
Platelets: 210 10*3/uL (ref 150–400)
RBC: 4.65 MIL/uL (ref 3.87–5.11)
RDW: 12.7 % (ref 11.5–15.5)
WBC: 8.5 10*3/uL (ref 4.0–10.5)
nRBC: 0 % (ref 0.0–0.2)

## 2021-04-21 LAB — LIPASE, BLOOD: Lipase: 26 U/L (ref 11–51)

## 2021-04-21 LAB — POC URINE PREG, ED: Preg Test, Ur: NEGATIVE

## 2021-04-21 NOTE — ED Notes (Signed)
Pt called to recheck vitals and no answer at this time.

## 2021-04-21 NOTE — ED Triage Notes (Signed)
Pt comes with c/o LLQ pain for few days. Pt denies any N/V/D.\  Pt states some odor with urination.

## 2021-07-26 ENCOUNTER — Encounter: Payer: Self-pay | Admitting: Emergency Medicine

## 2021-07-26 ENCOUNTER — Emergency Department
Admission: EM | Admit: 2021-07-26 | Discharge: 2021-07-26 | Disposition: A | Discharge status: Home / Self Care | Payer: Medicaid Other | Attending: Student in an Organized Health Care Education/Training Program | Admitting: Student in an Organized Health Care Education/Training Program

## 2021-07-26 ENCOUNTER — Other Ambulatory Visit: Payer: Self-pay

## 2021-07-26 DIAGNOSIS — L03211 Cellulitis of face: Secondary | ICD-10-CM | POA: Insufficient documentation

## 2021-07-26 DIAGNOSIS — L539 Erythematous condition, unspecified: Secondary | ICD-10-CM | POA: Diagnosis present

## 2021-07-26 MED ORDER — MUPIROCIN CALCIUM 2 % EX CREA: 1.0000 "application " | TOPICAL_CREAM | Freq: Two times a day (BID) | CUTANEOUS | 0 refills | Status: AC

## 2021-07-26 MED ORDER — CLINDAMYCIN HCL 300 MG PO CAPS: 300.0000 mg | ORAL_CAPSULE | Freq: Three times a day (TID) | ORAL | 0 refills | Status: AC

## 2021-07-26 MED ORDER — CLINDAMYCIN HCL 150 MG PO CAPS
300.0000 mg | ORAL_CAPSULE | Freq: Once | ORAL | Status: AC
  Administered 2021-07-26: 300 mg via ORAL
  Filled 2021-07-26: qty 2

## 2021-07-26 NOTE — ED Provider Notes (Signed)
? ?[facility] ?Provider Note ? ? ? Event Date/Time  ? First MD Initiated Contact with Patient 07/26/21 1324   ?  (approximate) ? ? ?History  ? ?Abscess ? ? ?HPI ? ?Kelly Peterson is a 41 y.o. female presents the ER for evaluation of several days of progressive worsening redness to the tip of her nose thought was initially papillation try to pop did have some drainage coming from of is having worsening pain and swelling.  This that she tried taking some home antibiotics she had leftover from a previous infection.  With metronidazole.  Denies any fevers or chills.  She has been trying warm compresses without improvement. ?  ? ? ?Physical Exam  ? ?Triage Vital Signs: ?ED Triage Vitals  ?Enc Vitals Group  ?   BP 07/26/21 1242 (!) 147/111  ?   Pulse Rate 07/26/21 1242 82  ?   Resp 07/26/21 1242 16  ?   Temp 07/26/21 1242 98.5 ?F (36.9 ?C)  ?   Temp Source 07/26/21 1242 Oral  ?   SpO2 07/26/21 1242 100 %  ?   Weight 07/26/21 1243 125 lb (56.7 kg)  ?   Height 07/26/21 1243 5\' 3"  (1.6 m)  ?   Head Circumference --   ?   Peak Flow --   ?   Pain Score 07/26/21 1243 7  ?   Pain Loc --   ?   Pain Edu? --   ?   Excl. in GC? --   ? ? ?Most recent vital signs: ?Vitals:  ? 07/26/21 1242  ?BP: (!) 147/111  ?Pulse: 82  ?Resp: 16  ?Temp: 98.5 ?F (36.9 ?C)  ?SpO2: 100%  ? ? ? ?Constitutional: Alert  ?Eyes: Conjunctivae are normal.  ?Head: Atraumatic. ?Nose: No congestion/rhinnorhea.  There is some cellulitic changes to the tip of her nose with a small scab nondraining no purulence.  No foreign body noted.  No crepitus or blistering. ?Mouth/Throat: Mucous membranes are moist.   ?Neck: Painless ROM.  ?Cardiovascular:   Good peripheral circulation. ?Respiratory: Normal respiratory effort.  No retractions.  ?Gastrointestinal: Soft and nontender.  ?Musculoskeletal:  no deformity ?Neurologic:  MAE spontaneously. No gross focal neurologic deficits are appreciated.  ?Skin:  Skin is warm, dry and intact. No rash  noted. ?Psychiatric: Mood and affect are normal. Speech and behavior are normal. ? ? ? ?ED Results / Procedures / Treatments  ? ?Labs ?(all labs ordered are listed, but only abnormal results are displayed) ?Labs Reviewed - No data to display ? ? ?EKG ? ? ? ? ?RADIOLOGY ? ?EMERGENCY DEPARTMENT 07/28/21 SOFT TISSUE INTERPRETATION ?"Study: Limited Soft Tissue Ultrasound" ? ?INDICATIONS: Pain and Soft tissue infection ?Multiple views of the body part were obtained in real-time with a multi-frequency linear probe ? ?PERFORMED BY: Myself ?IMAGES ARCHIVED?: No ?SIDE:Midline ?BODY PART: nose ?INTERPRETATION:  Cellulitis present ? ? ? ?PROCEDURES: ? ?Critical Care performed:  ? ?Procedures ? ? ?MEDICATIONS ORDERED IN ED: ?Medications  ?clindamycin (CLEOCIN) capsule 300 mg (has no administration in time range)  ? ? ? ?IMPRESSION / MDM / ASSESSMENT AND PLAN / ED COURSE  ?I reviewed the triage vital signs and the nursing notes. ?             ?               ? ?Differential diagnosis includes, but is not limited to, abscess, cellulitis, erythroderma, allergic reaction, foreign body ? ? ?Patient presenting with cellulitic  changes of the tip of her nose as described above well-appearing nontoxic.  No fluctuance or drainable fluid collection based on bedside ultrasound.  Will be placed on antibiotic.  We discussed strict return precautions conservative management.  Patient demonstrates understanding agreeable to plan. ? ? ?  ? ? ?FINAL CLINICAL IMPRESSION(S) / ED DIAGNOSES  ? ?Final diagnoses:  ?Cellulitis of face  ? ? ? ?Rx / DC Orders  ? ?ED Discharge Orders   ? ?      Ordered  ?  clindamycin (CLEOCIN) 300 MG capsule  3 times daily       ? 07/26/21 1334  ?  mupirocin cream (BACTROBAN) 2 %  2 times daily       ? 07/26/21 1334  ? ?  ?  ? ?  ? ? ? ?Note:  This document was prepared using Dragon voice recognition software and may include unintentional dictation errors. ? ?  ?Willy Eddy, MD ?07/26/21 1336 ? ?

## 2021-07-26 NOTE — ED Triage Notes (Signed)
Pt here with an abscess on the tip of her nose that she first noticed 2 days ago. Pt then picked at it to try and pop it and then it increased in size and pain. Pt was taking abx Metronidazole from a previous infx. Pt stable in triage. ?

## 2021-10-03 ENCOUNTER — Emergency Department: Payer: Medicaid Other

## 2021-10-03 ENCOUNTER — Other Ambulatory Visit: Payer: Self-pay

## 2021-10-03 ENCOUNTER — Emergency Department
Admission: EM | Admit: 2021-10-03 | Discharge: 2021-10-03 | Disposition: A | Discharge status: Home / Self Care | Payer: Medicaid Other | Attending: Emergency Medicine | Admitting: Emergency Medicine

## 2021-10-03 DIAGNOSIS — L03211 Cellulitis of face: Secondary | ICD-10-CM

## 2021-10-03 LAB — COMPREHENSIVE METABOLIC PANEL
ALT: 21 U/L (ref 0–44)
AST: 21 U/L (ref 15–41)
Albumin: 4 g/dL (ref 3.5–5.0)
Alkaline Phosphatase: 67 U/L (ref 38–126)
Anion gap: 7 (ref 5–15)
BUN: 12 mg/dL (ref 6–20)
CO2: 25 mmol/L (ref 22–32)
Calcium: 9.1 mg/dL (ref 8.9–10.3)
Chloride: 105 mmol/L (ref 98–111)
Creatinine, Ser: 0.71 mg/dL (ref 0.44–1.00)
GFR, Estimated: >60 mL/min (ref >60)
Glucose, Bld: 121 mg/dL — ABNORMAL HIGH (ref 70–99)
Potassium: 4.4 mmol/L (ref 3.5–5.1)
Sodium: 137 mmol/L (ref 135–145)
Total Bilirubin: 0.5 mg/dL (ref 0.3–1.2)
Total Protein: 7.4 g/dL (ref 6.5–8.1)

## 2021-10-03 LAB — CBC WITH DIFFERENTIAL/PLATELET
Abs Immature Granulocytes: 0.02 10*3/uL (ref 0.00–0.07)
Basophils Absolute: 0 10*3/uL (ref 0.0–0.1)
Basophils Relative: 0 %
Eosinophils Absolute: 0.1 10*3/uL (ref 0.0–0.5)
Eosinophils Relative: 2 %
HCT: 40.3 % (ref 36.0–46.0)
Hemoglobin: 13.3 g/dL (ref 12.0–15.0)
Immature Granulocytes: 0 %
Lymphocytes Relative: 29 %
Lymphs Abs: 2.4 10*3/uL (ref 0.7–4.0)
MCH: 27.3 pg (ref 26.0–34.0)
MCHC: 33 g/dL (ref 30.0–36.0)
MCV: 82.8 fL (ref 80.0–100.0)
Monocytes Absolute: 0.6 10*3/uL (ref 0.1–1.0)
Monocytes Relative: 8 %
Neutro Abs: 5 10*3/uL (ref 1.7–7.7)
Neutrophils Relative %: 61 %
Platelets: 232 10*3/uL (ref 150–400)
RBC: 4.87 MIL/uL (ref 3.87–5.11)
RDW: 12.6 % (ref 11.5–15.5)
WBC: 8.2 10*3/uL (ref 4.0–10.5)
nRBC: 0 % (ref 0.0–0.2)

## 2021-10-03 LAB — POC URINE PREG, ED: Preg Test, Ur: NEGATIVE

## 2021-10-03 LAB — LACTIC ACID, PLASMA: Lactic Acid, Venous: 0.9 mmol/L (ref 0.5–1.9)

## 2021-10-03 MED ORDER — LIDOCAINE-EPINEPHRINE-TETRACAINE (LET) TOPICAL GEL
3.0000 mL | Freq: Once | TOPICAL | Status: AC
  Administered 2021-10-03: 3 mL via TOPICAL
  Filled 2021-10-03: qty 3

## 2021-10-03 MED ORDER — L.E.T. 4-0.05-0.5 % EX GEL: 0.5000 mL | Freq: Four times a day (QID) | CUTANEOUS | 0 refills | Status: AC | PRN

## 2021-10-03 MED ORDER — IOHEXOL 300 MG/ML  SOLN
75.0000 mL | Freq: Once | INTRAMUSCULAR | Status: AC | PRN
  Administered 2021-10-03: 75 mL via INTRAVENOUS

## 2021-10-03 MED ORDER — VANCOMYCIN HCL IN DEXTROSE 1-5 GM/200ML-% IV SOLN
1000.0000 mg | Freq: Once | INTRAVENOUS | Status: AC
  Administered 2021-10-03: 1000 mg via INTRAVENOUS
  Filled 2021-10-03: qty 200

## 2021-10-03 MED ORDER — CLINDAMYCIN HCL 150 MG PO CAPS: 450.0000 mg | ORAL_CAPSULE | Freq: Three times a day (TID) | ORAL | 0 refills | Status: AC

## 2021-10-03 NOTE — ED Triage Notes (Signed)
Pt states she has an abscess on her forehead that has gotten worse despite being on 2 different antibiotics for it- pt states the swelling has spread to her eyes and is making them feel itchy

## 2021-10-03 NOTE — ED Notes (Signed)
Iv antibiotics still infusing.

## 2021-10-03 NOTE — ED Provider Notes (Signed)
[facility] Provider Note   Event Date/Time   First MD Initiated Contact with Patient 10/03/21 1352     (approximate) History  Abscess  HPI Kelly Peterson is a 41 y.o. female with a past medical history of of amphetamine use disorder, alcohol abuse, benzodiazepine abuse, bipolar disorder who presents for an abscess to medial aspect above the right eyebrow.  Patient states that she has tried 2 separate antibiotics for this swelling and presumed abscess/cystic acne but the symptoms have not improved.  Patient is more concerned as she has been having periorbital swelling over the last 24 hours that is also tender to palpation.  Patient also notes tenderness to palpation over the entirety of her forehead.  Of note patient has only taken 1 day of clindamycin but has noticed worsening in her symptoms.  Patient also endorses generalized fatigue, subjective fevers.  Patient denies any blurred vision, pain with eye movement, difficulty swallowing, rhinorrhea, or pain in the jaw or ears Physical Exam  Triage Vital Signs: ED Triage Vitals  Enc Vitals Group     BP 10/03/21 1323 (!) 150/101     Pulse Rate 10/03/21 1323 95     Resp 10/03/21 1323 20     Temp 10/03/21 1323 98.4 F (36.9 C)     Temp Source 10/03/21 1323 Oral     SpO2 10/03/21 1323 100 %     Weight 10/03/21 1321 125 lb (56.7 kg)     Height 10/03/21 1321 5\' 3"  (1.6 m)     Head Circumference --      Peak Flow --      Pain Score 10/03/21 1320 1     Pain Loc --      Pain Edu? --      Excl. in GC? --    Most recent vital signs: Vitals:   10/03/21 1323 10/03/21 1612  BP: (!) 150/101 (!) 148/99  Pulse: 95 94  Resp: 20 18  Temp: 98.4 F (36.9 C)   SpO2: 100% 100%   General: Awake, oriented x4. CV:  Good peripheral perfusion.  Resp:  Normal effort.  Abd:  No distention. Other:  Patient has a area of 1 cm x 1 cm edema, fluctuance, and surrounding induration just superior and medial to the right  eyebrow.  Patient also shows a mild amount of periorbital edema without pain with extraocular movements.  Bedside ultrasound does show small amount of fluid collection underneath this centralized edema with what looks to be at least 2-3 loculations ED Results / Procedures / Treatments  Labs (all labs ordered are listed, but only abnormal results are displayed) Labs Reviewed  COMPREHENSIVE METABOLIC PANEL - Abnormal; Notable for the following components:      Result Value   Glucose, Bld 121 (*)    All other components within normal limits  LACTIC ACID, PLASMA  CBC WITH DIFFERENTIAL/PLATELET  POC URINE PREG, ED   RADIOLOGY ED MD interpretation: CT of the maxillofacial structures with IV contrast interpreted by me and shows soft tissue swelling the area of superior medial right eyebrow without evidence of intraorbital extension or abscess -Agree with radiology assessment Official radiology report(s): CT Maxillofacial W Contrast  Result Date: 10/03/2021 CLINICAL DATA:  Abscess to right eyebrow with worsening forehead and eye swelling despite antibiotics EXAM: CT MAXILLOFACIAL WITH CONTRAST TECHNIQUE: Multidetector CT imaging of the maxillofacial structures was performed with intravenous contrast. Multiplanar CT image reconstructions were also generated. RADIATION DOSE REDUCTION: This exam was performed  according to the departmental dose-optimization program which includes automated exposure control, adjustment of the mA and/or kV according to patient size and/or use of iterative reconstruction technique. CONTRAST:  62mL OMNIPAQUE IOHEXOL 300 MG/ML  SOLN COMPARISON:  2013 FINDINGS: Osseous: No acute or significant osseous abnormality. Orbits: No postseptal inflammatory changes. No other significant abnormality. Sinuses: Aerated. Soft tissues: Mild skin thickening underlying the skin marker. There is underlying subcutaneous fat infiltration extending to the nasal bridge and supraorbital regions. No  abscess. Limited intracranial: Unremarkable.  No abnormal enhancement. IMPRESSION: Soft tissue swelling in the area of interest without abscess. No evidence of intraorbital extension. Electronically Signed   By: Guadlupe Spanish M.D.   On: 10/03/2021 15:44   PROCEDURES: Critical Care performed: No Procedures MEDICATIONS ORDERED IN ED: Medications  vancomycin (VANCOCIN) IVPB 1000 mg/200 mL premix (1,000 mg Intravenous New Bag/Given 10/03/21 1607)  iohexol (OMNIPAQUE) 300 MG/ML solution 75 mL (75 mLs Intravenous Contrast Given 10/03/21 1447)  lidocaine-EPINEPHrine-tetracaine (LET) topical gel (3 mLs Topical Given 10/03/21 1603)   IMPRESSION / MDM / ASSESSMENT AND PLAN / ED COURSE  I reviewed the triage vital signs and the nursing notes.                             Differential diagnosis includes, but is not limited to, SSTI, sinus infection, cavernous sinus thrombosis, sepsis, cystic acne The patient is on the cardiac monitor to evaluate for evidence of arrhythmia and/or significant heart rate changes. Patient is a 41 year old female with above-stated past medical history and HPI.  Laboratory evaluation does not show any evidence of acute abnormalities including no leukocytosis and no lactic acidosis. CT scan of the maxillofacial structures with concern for possible sinus infection from dental source shows no abscess and no signs of intraorbital extension. As patient has only had 1 day of oral clindamycin, patient will benefit from 1 dose of IV vancomycin as well as continuance of her clindamycin dosage. Dispo: Discharge home with PCP follow-up   FINAL CLINICAL IMPRESSION(S) / ED DIAGNOSES   Final diagnoses:  Cellulitis of face   Rx / DC Orders   ED Discharge Orders          Ordered    lido-EPINEPHrine-Tetracaine (L.E.T.) 4-0.05-0.5 % GEL  Every 6 hours PRN        10/03/21 1616    clindamycin (CLEOCIN) 150 MG capsule  3 times daily        10/03/21 1619           Note:  This document  was prepared using Dragon voice recognition software and may include unintentional dictation errors.   Merwyn Katos, MD 10/03/21 [phone]

## 2024-06-05 ENCOUNTER — Encounter: Payer: Self-pay | Admitting: Emergency Medicine

## 2024-06-05 ENCOUNTER — Emergency Department

## 2024-06-05 ENCOUNTER — Emergency Department: Admission: EM | Admit: 2024-06-05 | Discharge: 2024-06-05 | Disposition: A | Discharge status: Home / Self Care

## 2024-06-05 ENCOUNTER — Other Ambulatory Visit: Payer: Self-pay

## 2024-06-05 DIAGNOSIS — Y92410 Unspecified street and highway as the place of occurrence of the external cause: Secondary | ICD-10-CM | POA: Insufficient documentation

## 2024-06-05 DIAGNOSIS — M791 Myalgia, unspecified site: Secondary | ICD-10-CM | POA: Diagnosis not present

## 2024-06-05 DIAGNOSIS — M7918 Myalgia, other site: Secondary | ICD-10-CM

## 2024-06-05 DIAGNOSIS — M545 Low back pain, unspecified: Secondary | ICD-10-CM | POA: Diagnosis present

## 2024-06-05 LAB — PREGNANCY, URINE: Preg Test, Ur: NEGATIVE

## 2024-06-05 LAB — URINALYSIS, ROUTINE W REFLEX MICROSCOPIC
Bilirubin Urine: NEGATIVE
Glucose, UA: NEGATIVE mg/dL
Ketones, ur: NEGATIVE mg/dL
Leukocytes,Ua: NEGATIVE
Nitrite: NEGATIVE
Protein, ur: NEGATIVE mg/dL
Specific Gravity, Urine: 1.002 — ABNORMAL LOW (ref 1.005–1.030)
pH: 6 (ref 5.0–8.0)

## 2024-06-05 MED ORDER — IBUPROFEN 600 MG PO TABS: 600.0000 mg | ORAL_TABLET | Freq: Four times a day (QID) | ORAL | 0 refills | Status: AC | PRN

## 2024-06-05 MED ORDER — BACLOFEN 10 MG PO TABS: 10.0000 mg | ORAL_TABLET | Freq: Three times a day (TID) | ORAL | 0 refills | Status: AC

## 2024-06-05 MED ORDER — IBUPROFEN 600 MG PO TABS
600.0000 mg | ORAL_TABLET | Freq: Once | ORAL | Status: AC
  Administered 2024-06-05: 600 mg via ORAL
  Filled 2024-06-05: qty 1

## 2024-06-05 NOTE — ED Notes (Signed)
 Pt verbalizes understanding of discharge instructions. Opportunity for questioning and answers were provided. Pt discharged from ED at this time.

## 2024-06-05 NOTE — Discharge Instructions (Signed)
 Apply ice to all areas that hurt.  Take medication as prescribed Return emergency department if you are worsening as she will need a CT at that time. Follow-up with orthopedics if not improving in 1 week as she may need physical therapy.
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
## 2024-06-12 ENCOUNTER — Ambulatory Visit

## 2024-06-18 ENCOUNTER — Ambulatory Visit

## 2024-06-18 VITALS — BP 145/95 | HR 81 | Ht 63.0 in | Wt 136.6 lb

## 2024-06-18 DIAGNOSIS — M25552 Pain in left hip: Secondary | ICD-10-CM

## 2024-06-18 DIAGNOSIS — M898X8 Other specified disorders of bone, other site: Secondary | ICD-10-CM | POA: Diagnosis not present

## 2024-06-18 DIAGNOSIS — S39012A Strain of muscle, fascia and tendon of lower back, initial encounter: Secondary | ICD-10-CM | POA: Diagnosis not present

## 2024-07-30 ENCOUNTER — Ambulatory Visit
